# Patient Record
Sex: Female | Born: 1950 | Race: White | Hispanic: No | Marital: Married | Smoking: Never smoker
Health system: Southern US, Community
[De-identification: ages and names within clinical notes are randomized; demographics above are authoritative.]

## PROBLEM LIST (undated history)

## (undated) DIAGNOSIS — F419 Anxiety disorder, unspecified: Secondary | ICD-10-CM

## (undated) DIAGNOSIS — M8580 Other specified disorders of bone density and structure, unspecified site: Secondary | ICD-10-CM

## (undated) DIAGNOSIS — I959 Hypotension, unspecified: Secondary | ICD-10-CM

## (undated) DIAGNOSIS — E785 Hyperlipidemia, unspecified: Secondary | ICD-10-CM

## (undated) DIAGNOSIS — R413 Other amnesia: Secondary | ICD-10-CM

## (undated) DIAGNOSIS — R634 Abnormal weight loss: Secondary | ICD-10-CM

## (undated) DIAGNOSIS — R339 Retention of urine, unspecified: Secondary | ICD-10-CM

## (undated) DIAGNOSIS — H04129 Dry eye syndrome of unspecified lacrimal gland: Secondary | ICD-10-CM

## (undated) DIAGNOSIS — R630 Anorexia: Secondary | ICD-10-CM

## (undated) DIAGNOSIS — R42 Dizziness and giddiness: Secondary | ICD-10-CM

## (undated) HISTORY — DX: Dry eye syndrome of unspecified lacrimal gland: H04.129

## (undated) HISTORY — PX: ABDOMINAL HYSTERECTOMY: SHX81

## (undated) HISTORY — DX: Abnormal weight loss: R63.4

## (undated) HISTORY — PX: APPENDECTOMY: SHX54

## (undated) HISTORY — DX: Other specified disorders of bone density and structure, unspecified site: M85.80

## (undated) HISTORY — DX: Retention of urine, unspecified: R33.9

## (undated) HISTORY — DX: Hyperlipidemia, unspecified: E78.5

## (undated) HISTORY — DX: Other amnesia: R41.3

## (undated) HISTORY — DX: Dizziness and giddiness: R42

## (undated) HISTORY — DX: Hypotension, unspecified: I95.9

## (undated) HISTORY — DX: Anorexia: R63.0

## (undated) HISTORY — PX: CATARACT EXTRACTION: SUR2

## (undated) HISTORY — DX: Anxiety disorder, unspecified: F41.9

---

## 1998-08-06 ENCOUNTER — Other Ambulatory Visit: Admission: RE | Admit: 1998-08-06 | Discharge: 1998-08-06 | Payer: Self-pay | Admitting: Obstetrics and Gynecology

## 1999-08-10 ENCOUNTER — Other Ambulatory Visit: Admission: RE | Admit: 1999-08-10 | Discharge: 1999-08-10 | Payer: Self-pay | Admitting: Obstetrics and Gynecology

## 2000-01-03 ENCOUNTER — Encounter (INDEPENDENT_AMBULATORY_CARE_PROVIDER_SITE_OTHER): Payer: Self-pay

## 2000-01-03 ENCOUNTER — Ambulatory Visit (HOSPITAL_COMMUNITY): Admission: RE | Admit: 2000-01-03 | Discharge: 2000-01-03 | Payer: Self-pay | Admitting: Gastroenterology

## 2000-08-15 ENCOUNTER — Other Ambulatory Visit: Admission: RE | Admit: 2000-08-15 | Discharge: 2000-08-15 | Payer: Self-pay | Admitting: Obstetrics and Gynecology

## 2001-09-06 ENCOUNTER — Other Ambulatory Visit: Admission: RE | Admit: 2001-09-06 | Discharge: 2001-09-06 | Payer: Self-pay | Admitting: Obstetrics and Gynecology

## 2002-09-19 ENCOUNTER — Other Ambulatory Visit: Admission: RE | Admit: 2002-09-19 | Discharge: 2002-09-19 | Payer: Self-pay | Admitting: Obstetrics and Gynecology

## 2003-09-30 ENCOUNTER — Other Ambulatory Visit: Admission: RE | Admit: 2003-09-30 | Discharge: 2003-09-30 | Payer: Self-pay | Admitting: Obstetrics and Gynecology

## 2004-10-08 ENCOUNTER — Other Ambulatory Visit: Admission: RE | Admit: 2004-10-08 | Discharge: 2004-10-08 | Payer: Self-pay | Admitting: Obstetrics and Gynecology

## 2005-10-13 ENCOUNTER — Other Ambulatory Visit: Admission: RE | Admit: 2005-10-13 | Discharge: 2005-10-13 | Payer: Self-pay | Admitting: Obstetrics and Gynecology

## 2006-10-19 ENCOUNTER — Other Ambulatory Visit: Admission: RE | Admit: 2006-10-19 | Discharge: 2006-10-19 | Payer: Self-pay | Admitting: Obstetrics and Gynecology

## 2007-10-25 ENCOUNTER — Other Ambulatory Visit: Admission: RE | Admit: 2007-10-25 | Discharge: 2007-10-25 | Payer: Self-pay | Admitting: Obstetrics and Gynecology

## 2008-10-21 ENCOUNTER — Encounter: Admission: RE | Admit: 2008-10-21 | Discharge: 2008-10-21 | Payer: Self-pay | Admitting: Obstetrics and Gynecology

## 2008-11-06 ENCOUNTER — Encounter: Payer: Self-pay | Admitting: Obstetrics and Gynecology

## 2008-11-06 ENCOUNTER — Other Ambulatory Visit: Admission: RE | Admit: 2008-11-06 | Discharge: 2008-11-06 | Payer: Self-pay | Admitting: Obstetrics and Gynecology

## 2008-11-06 ENCOUNTER — Ambulatory Visit: Payer: Self-pay | Admitting: Obstetrics and Gynecology

## 2009-01-06 ENCOUNTER — Encounter: Admission: RE | Admit: 2009-01-06 | Discharge: 2009-01-06 | Payer: Self-pay | Admitting: Obstetrics and Gynecology

## 2009-12-01 ENCOUNTER — Encounter: Admission: RE | Admit: 2009-12-01 | Discharge: 2009-12-01 | Payer: Self-pay | Admitting: Obstetrics & Gynecology

## 2010-10-01 NOTE — Procedures (Signed)
[facility]  Patient:    Jennifer Mccall, CRUMBLEY                    MRN: [id number] Proc. Date: 01/03/00 Adm. Date:  [id number] Attending:  Rich Brave CC:         Rande Brunt. Eda Paschal, M.D.   Procedure Report  PROCEDURE:  Colonoscopy with biopsies.  INDICATIONS FOR PROCEDURE:  A 60 year old with history of intermittent small volume hematochezia and family history of colon cancer in her grandmother as well as colon polyps in her mother.  FINDINGS:  Unremarkable exam. Tiny possible early polyp in the ascending colon.  DESCRIPTION OF PROCEDURE:  The nature, purpose and risk of the procedure had been discussed with the patient who provided written consent. Sedation was fentanyl 62.5 mcg and Versed 7 mg IV without arrhythmias or desaturation. The Olympus pediatric video colonoscope was advanced through a somewhat looping and spastic sigmoid region and then fairly easily around the colon to the terminal ileum, which had a normal appearance. Pullback was then performed. I did have the patient in the supine position and there was application of external abdominal compression to facilitate advancement of the scope during insertion.  There was what appeared to be a very small sessile raised area on a fold in the proximal ascending colon, possibly representing an early polyp. Several biopsies were obtained of this area for histologic analysis and it appears that these essentially excised the tissue in question.  No definite polyps, no large polyps, no cancer, no colitis, no diverticular disease nor any vascular malformations were observed.  I did not see any source of bleeding in the patients rectum.  The patient tolerated the procedure well and there were no apparent complications.  IMPRESSION:  Questionable early polyp in the ascending colon, otherwise normal exam.  PLAN:  Consider follow-up colonoscopy in 5 years in view of the family  history of colorectal neoplasia, and possibly a flexible sigmoidoscopy prior to that time if the patient continues to have rectal bleeding. DD:  01/03/00 TD:  01/03/00 Job: [id number]

## 2010-12-17 ENCOUNTER — Other Ambulatory Visit: Payer: Self-pay | Admitting: Obstetrics & Gynecology

## 2010-12-17 DIAGNOSIS — Z1231 Encounter for screening mammogram for malignant neoplasm of breast: Secondary | ICD-10-CM

## 2011-01-05 ENCOUNTER — Ambulatory Visit
Admission: RE | Admit: 2011-01-05 | Discharge: 2011-01-05 | Disposition: A | Discharge status: Home / Self Care | Payer: BC Managed Care – PPO | Source: Ambulatory Visit | Attending: Obstetrics & Gynecology | Admitting: Obstetrics & Gynecology

## 2011-01-05 DIAGNOSIS — Z1231 Encounter for screening mammogram for malignant neoplasm of breast: Secondary | ICD-10-CM

## 2012-02-27 ENCOUNTER — Ambulatory Visit: Payer: BC Managed Care – PPO | Admitting: Cardiovascular Disease

## 2012-03-20 ENCOUNTER — Encounter: Payer: Self-pay | Admitting: Cardiovascular Disease

## 2012-03-22 ENCOUNTER — Ambulatory Visit (INDEPENDENT_AMBULATORY_CARE_PROVIDER_SITE_OTHER): Payer: BC Managed Care – PPO | Admitting: Cardiovascular Disease

## 2012-03-22 ENCOUNTER — Encounter: Payer: Self-pay | Admitting: Cardiovascular Disease

## 2012-03-22 VITALS — BP 118/72 | HR 63 | Ht 63.6 in | Wt 139.0 lb

## 2012-03-22 DIAGNOSIS — R002 Palpitations: Secondary | ICD-10-CM

## 2012-03-22 LAB — BASIC METABOLIC PANEL
BUN: 12 mg/dL (ref 6–23)
CO2: 29 mEq/L (ref 19–32)
Calcium: 9.5 mg/dL (ref 8.4–10.5)

## 2012-03-22 NOTE — Patient Instructions (Addendum)
Your physician recommends that you schedule a follow-up appointment in:  4 weeks.   Your physician has recommended that you wear a holter monitor. Holter monitors are medical devices that record the heart's electrical activity. Doctors most often use these monitors to diagnose arrhythmias. Arrhythmias are problems with the speed or rhythm of the heartbeat. The monitor is a small, portable device. You can wear one while you do your normal daily activities. This is usually used to diagnose what is causing palpitations/syncope (passing out).  Your physician has requested that you have an echocardiogram. Echocardiography is a painless test that uses sound waves to create images of your heart. It provides your doctor with information about the size and shape of your heart and how well your heart's chambers and valves are working. This procedure takes approximately one hour. There are no restrictions for this procedure.   

## 2012-03-22 NOTE — Progress Notes (Signed)
   History of Present Illness: 61 yo WF with history of hyperlipidemia here today for evaluation of "racing heart". She has been noticing this for 1.5 years. This lasts for several minutes. This happens most during stress. No chest pain. She does have feeling of dyspnea at times. EKG has been normal in the past. She has been treated with Xanax for anxiety. Going through a divorce and is under much stress. She has been living with her business partner for the last year.   Primary Care Physician: Loree Fee, PA-C.   Total chol 239  HDL 96  LDL 131  Past Medical History  Diagnosis Date  . Hyperlipidemia   . Anxiety     Past Surgical History  Procedure Date  . Abdominal hysterectomy   . Appendectomy     Current Outpatient Prescriptions  Medication Sig Dispense Refill  . acyclovir (ZOVIRAX) 200 MG capsule Take 200 mg by mouth once. Daily      . aspirin 325 MG tablet Take 325 mg by mouth daily.        Allergies  Allergen Reactions  . Codeine   . Demerol (Meperidine)   . Erythorbic Acid     History   Social History  . Marital Status: Unknown    Spouse Name: N/A    Number of Children: 2  . Years of Education: N/A   Occupational History  . Counselor    Social History Main Topics  . Smoking status: Never Smoker   . Smokeless tobacco: Not on file  . Alcohol Use: 3.5 oz/week    7 drink(s) per week  . Drug Use: No  . Sexually Active: Not on file   Other Topics Concern  . Not on file   Social History Narrative  . No narrative on file    Family History  Problem Relation Age of Onset  . Dementia Mother   . Cancer - Lung Father   . Heart attack Paternal Grandfather     Review of Systems:  As stated in the HPI and otherwise negative.   BP 118/72  Pulse 63  Ht 5' 3.6" (1.615 m)  Wt 139 lb (63.05 kg)  BMI 24.16 kg/m2  SpO2 100%  Physical Examination: General: Well developed, well nourished, NAD HEENT: OP clear, mucus membranes moist SKIN: warm, dry. No  rashes. Neuro: No focal deficits Musculoskeletal: Muscle strength 5/5 all ext Psychiatric: Mood and affect normal Neck: No JVD, no carotid bruits, no thyromegaly, no lymphadenopathy. Lungs:Clear bilaterally, no wheezes, rhonci, crackles Cardiovascular: Regular rate and rhythm. No murmurs, gallops or rubs. Abdomen:Soft. Bowel sounds present. Non-tender.  Extremities: No lower extremity edema. Pulses are 2 + in the bilateral DP/PT.  EKG: NSR, rate 60 bpm.   Assessment and Plan:   1. Palpitations: Likely premature beats. Will check echo to exclude structural heart disease. Will have her wear a 48 hour Holter monitor. Will check TSH and BMET today.

## 2012-04-03 ENCOUNTER — Encounter: Payer: Self-pay | Admitting: Cardiovascular Disease

## 2012-04-10 ENCOUNTER — Other Ambulatory Visit: Payer: Self-pay | Admitting: Obstetrics & Gynecology

## 2012-04-10 DIAGNOSIS — Z1231 Encounter for screening mammogram for malignant neoplasm of breast: Secondary | ICD-10-CM

## 2012-04-17 ENCOUNTER — Other Ambulatory Visit (HOSPITAL_COMMUNITY): Payer: BC Managed Care – PPO

## 2012-05-02 ENCOUNTER — Ambulatory Visit: Payer: BC Managed Care – PPO | Admitting: Cardiovascular Disease

## 2012-06-05 ENCOUNTER — Ambulatory Visit
Admission: RE | Admit: 2012-06-05 | Discharge: 2012-06-05 | Disposition: A | Discharge status: Home / Self Care | Payer: BC Managed Care – PPO | Source: Ambulatory Visit | Attending: Obstetrics & Gynecology | Admitting: Obstetrics & Gynecology

## 2012-06-05 DIAGNOSIS — Z1231 Encounter for screening mammogram for malignant neoplasm of breast: Secondary | ICD-10-CM

## 2012-11-20 ENCOUNTER — Telehealth: Payer: Self-pay | Admitting: Cardiovascular Disease

## 2012-11-20 DIAGNOSIS — R0600 Dyspnea, unspecified: Secondary | ICD-10-CM

## 2012-11-20 DIAGNOSIS — R002 Palpitations: Secondary | ICD-10-CM

## 2012-11-20 NOTE — Telephone Encounter (Signed)
Spoke with pt. She reports she canceled previous tests because she got very busy at work. She is continuing to have palpitations and would like to schedule tests at this time.  Symptoms same as when she previously saw Dr. Clifton James.  I told pt I would have schedulers contact her with appointment times. Appt made for pt to see Dr. Clifton James on January 09, 2013 at 9:30 for follow up and to review results.

## 2012-11-20 NOTE — Telephone Encounter (Signed)
Ok. Thanks. chris

## 2012-11-20 NOTE — Telephone Encounter (Signed)
New problem    Patient had cancel her echo & holter appt in Dec 2013. Need an new order put into the system for holter  & echo.

## 2012-11-20 NOTE — Telephone Encounter (Signed)
Pt saw Dr. Clifton James in November 2013 with 48 hr holter and echo planned. Pt cancelled these appointments and also cancelled follow up with Dr. Clifton James. Cancellation notes indicate she cancelled due to insurance and would call back in January to reschedule.  I left message for pt to call back to see how she was feeling.

## 2012-11-22 ENCOUNTER — Other Ambulatory Visit: Payer: Self-pay | Admitting: *Deleted

## 2012-11-22 DIAGNOSIS — R0600 Dyspnea, unspecified: Secondary | ICD-10-CM

## 2012-11-27 ENCOUNTER — Other Ambulatory Visit (HOSPITAL_COMMUNITY): Payer: BC Managed Care – PPO

## 2012-12-10 ENCOUNTER — Encounter (INDEPENDENT_AMBULATORY_CARE_PROVIDER_SITE_OTHER): Payer: BC Managed Care – PPO

## 2012-12-10 ENCOUNTER — Ambulatory Visit (HOSPITAL_COMMUNITY): Payer: BC Managed Care – PPO | Attending: Internal Medicine | Admitting: Radiology

## 2012-12-10 ENCOUNTER — Encounter: Payer: Self-pay | Admitting: *Deleted

## 2012-12-10 DIAGNOSIS — I079 Rheumatic tricuspid valve disease, unspecified: Secondary | ICD-10-CM | POA: Insufficient documentation

## 2012-12-10 DIAGNOSIS — R002 Palpitations: Secondary | ICD-10-CM | POA: Insufficient documentation

## 2012-12-10 DIAGNOSIS — E785 Hyperlipidemia, unspecified: Secondary | ICD-10-CM | POA: Insufficient documentation

## 2012-12-10 DIAGNOSIS — I359 Nonrheumatic aortic valve disorder, unspecified: Secondary | ICD-10-CM | POA: Insufficient documentation

## 2012-12-10 DIAGNOSIS — R0609 Other forms of dyspnea: Secondary | ICD-10-CM | POA: Insufficient documentation

## 2012-12-10 DIAGNOSIS — R0600 Dyspnea, unspecified: Secondary | ICD-10-CM

## 2012-12-10 DIAGNOSIS — R0989 Other specified symptoms and signs involving the circulatory and respiratory systems: Secondary | ICD-10-CM | POA: Insufficient documentation

## 2012-12-10 DIAGNOSIS — R0602 Shortness of breath: Secondary | ICD-10-CM | POA: Insufficient documentation

## 2012-12-10 NOTE — Progress Notes (Signed)
Echocardiogram performed.  

## 2012-12-10 NOTE — Progress Notes (Signed)
Patient ID: Jennifer Mccall, female   DOB: 06/06/1950, 62 y.o.   MRN: [id number] EVO 48 Hour Holter Monitor applied to patient.

## 2012-12-24 ENCOUNTER — Telehealth: Payer: Self-pay | Admitting: *Deleted

## 2012-12-24 NOTE — Telephone Encounter (Signed)
I placed call to pt to review monitor results. Left message to call back 

## 2012-12-25 NOTE — Telephone Encounter (Signed)
F/up  Pt returning a phone call... Pt request that if a message is left please leave a name so that she may know who to call back.

## 2012-12-25 NOTE — Telephone Encounter (Signed)
Returned call to patient monitor results given.Advised to avoid caffeine.Advised to keep appointment with Dr.McAlhany 01/09/13.

## 2013-01-09 ENCOUNTER — Ambulatory Visit (INDEPENDENT_AMBULATORY_CARE_PROVIDER_SITE_OTHER): Payer: BC Managed Care – PPO | Admitting: Cardiovascular Disease

## 2013-01-09 ENCOUNTER — Encounter: Payer: Self-pay | Admitting: Cardiovascular Disease

## 2013-01-09 VITALS — BP 100/60 | HR 62 | Ht 64.0 in | Wt 142.0 lb

## 2013-01-09 DIAGNOSIS — I340 Nonrheumatic mitral (valve) insufficiency: Secondary | ICD-10-CM

## 2013-01-09 DIAGNOSIS — I4949 Other premature depolarization: Secondary | ICD-10-CM

## 2013-01-09 DIAGNOSIS — I493 Ventricular premature depolarization: Secondary | ICD-10-CM | POA: Insufficient documentation

## 2013-01-09 DIAGNOSIS — R002 Palpitations: Secondary | ICD-10-CM

## 2013-01-09 DIAGNOSIS — I059 Rheumatic mitral valve disease, unspecified: Secondary | ICD-10-CM

## 2013-01-09 DIAGNOSIS — I498 Other specified cardiac arrhythmias: Secondary | ICD-10-CM

## 2013-01-09 DIAGNOSIS — I471 Supraventricular tachycardia: Secondary | ICD-10-CM | POA: Insufficient documentation

## 2013-01-09 NOTE — Progress Notes (Signed)
History of Present Illness: 62 yo WF with history of hyperlipidemia here today for cardiac follow up. I saw her 03/22/12 for evaluation of "racing heart". She had been noticing this for 1.5 years. This lasts for several minutes. This happens most during stress. No chest pain. She did have feeling of dyspnea at times. I arranged an echo and a 48 hour Holter monitor but she cancelled and also cancelled f/u with me. She called July 2014 and had c/o continued palpitations. 48 hour holter monitor showed PACs, PVCs, 7 beat run SVT. Echo with normal LV size and function, mild AI, moderate MR.   She is here today for follow up.   Primary Care Physician: Loree Fee, PA-C.   Past Medical History  Diagnosis Date  . Hyperlipidemia   . Anxiety     Past Surgical History  Procedure Laterality Date  . Abdominal hysterectomy    . Appendectomy      Current Outpatient Prescriptions  Medication Sig Dispense Refill  . acyclovir (ZOVIRAX) 200 MG capsule Take 200 mg by mouth once. Daily      . aspirin 325 MG tablet Take 325 mg by mouth daily.       No current facility-administered medications for this visit.    Allergies  Allergen Reactions  . Codeine   . Demerol [Meperidine]   . Erythorbic Acid     History   Social History  . Marital Status: Unknown    Spouse Name: N/A    Number of Children: 2  . Years of Education: N/A   Occupational History  . Counselor    Social History Main Topics  . Smoking status: Never Smoker   . Smokeless tobacco: Not on file  . Alcohol Use: 3.5 oz/week    7 drink(s) per week  . Drug Use: No  . Sexual Activity: Not on file   Other Topics Concern  . Not on file   Social History Narrative  . No narrative on file    Family History  Problem Relation Age of Onset  . Dementia Mother   . Cancer - Lung Father   . Heart attack Paternal Grandfather     Review of Systems:  As stated in the HPI and otherwise negative.   BP 100/60  Pulse 62  Ht 5\' 4"   (1.626 m)  Wt 142 lb (64.411 kg)  BMI 24.36 kg/m2  Physical Examination: General: Well developed, well nourished, NAD HEENT: OP clear, mucus membranes moist SKIN: warm, dry. No rashes. Neuro: No focal deficits Musculoskeletal: Muscle strength 5/5 all ext Psychiatric: Mood and affect normal Neck: No JVD, no carotid bruits, no thyromegaly, no lymphadenopathy. Lungs:Clear bilaterally, no wheezes, rhonci, crackles Cardiovascular: Regular rate and rhythm. No murmurs, gallops or rubs. Abdomen:Soft. Bowel sounds present. Non-tender.  Extremities: No lower extremity edema. Pulses are 2 + in the bilateral DP/PT.  Echo 12/10/12: Left ventricle: The cavity size was normal. Wall thickness was normal. Systolic function was normal. The estimated ejection fraction was in the range of 55% to 60%. - Aortic valve: Mild regurgitation. - Mitral valve: MR is directed more posterior into LA At least moderate in intensity.  Assessment and Plan:   1. Palpitations: Holter monitor with PVCs, PACs, short run of SVT. She thinks these are better over last few weeks. Clear triggers to symptoms. She is asked to avoid these triggers including caffeine and over the counter stimulants. I have discussed starting Toprol XL but she wishes to not start any medications. Will call  with worsening of symptoms.   2. Mitral regurgitation: Moderate by echo. Repeat echo one year.

## 2013-01-09 NOTE — Patient Instructions (Addendum)
Your physician wants you to follow-up in:  12 months. You will receive a reminder letter in the mail two months in advance. If you don't receive a letter, please call our office to schedule the follow-up appointment.  Your physician has requested that you have an echocardiogram. Echocardiography is a painless test that uses sound waves to create images of your heart. It provides your doctor with information about the size and shape of your heart and how well your heart's chambers and valves are working. This procedure takes approximately one hour. There are no restrictions for this procedure. To be done in 1-2 months.     

## 2013-06-15 ENCOUNTER — Other Ambulatory Visit: Payer: Self-pay | Admitting: Gynecology

## 2013-06-17 MED ORDER — ACYCLOVIR 200 MG PO CAPS: ORAL_CAPSULE | ORAL | Status: DC

## 2013-06-17 NOTE — Addendum Note (Signed)
Addended by: Alfonzo Feller on: 06/17/2013 05:55 PM   Modules accepted: Orders

## 2013-06-17 NOTE — Telephone Encounter (Signed)
Last refilled: 03/20/12 No current AEX  S/W patient she has moved to [place] she has not found GYN yet was wondering if Dr. Charlies Constable could refill her Acyclovir for 2 month that way it would give her time to find a GYN I told patient I would check with Dr.Lathrop first.  Please advise.

## 2013-06-17 NOTE — Telephone Encounter (Signed)
Chart In Your Door

## 2014-07-14 ENCOUNTER — Other Ambulatory Visit: Payer: Self-pay | Admitting: *Deleted

## 2014-07-14 NOTE — Telephone Encounter (Signed)
Faxed Medication refill request: Acyclovir 200 mg  Last AEX:  03/20/12 with TL Next AEX: No AEX scheduled  Last MMG (if hormonal medication request): N/A Refill authorized: None   Patient states her physician is in [place] and this fax wasn't supposed to be faxed to [place]. She said she will contact her doctor's office in regards to refill and appreciative of the call.  RX denied through our system.  Routed to provider for review, encounter closed.

## 2016-05-25 DIAGNOSIS — E785 Hyperlipidemia, unspecified: Secondary | ICD-10-CM | POA: Diagnosis not present

## 2016-05-25 DIAGNOSIS — Z0000 Encounter for general adult medical examination without abnormal findings: Secondary | ICD-10-CM | POA: Diagnosis not present

## 2016-05-25 DIAGNOSIS — Z1211 Encounter for screening for malignant neoplasm of colon: Secondary | ICD-10-CM | POA: Diagnosis not present

## 2016-05-25 DIAGNOSIS — Z8639 Personal history of other endocrine, nutritional and metabolic disease: Secondary | ICD-10-CM | POA: Diagnosis not present

## 2016-10-10 ENCOUNTER — Encounter (HOSPITAL_COMMUNITY): Payer: Self-pay | Admitting: Emergency Medicine

## 2016-10-10 ENCOUNTER — Emergency Department (HOSPITAL_COMMUNITY)
Admission: EM | Admit: 2016-10-10 | Discharge: 2016-10-10 | Disposition: A | Discharge status: Home / Self Care | Payer: Medicare Other | Attending: Physician Assistant | Admitting: Physician Assistant

## 2016-10-10 DIAGNOSIS — R55 Syncope and collapse: Secondary | ICD-10-CM | POA: Diagnosis not present

## 2016-10-10 DIAGNOSIS — Z79899 Other long term (current) drug therapy: Secondary | ICD-10-CM | POA: Diagnosis not present

## 2016-10-10 DIAGNOSIS — R110 Nausea: Secondary | ICD-10-CM | POA: Diagnosis not present

## 2016-10-10 DIAGNOSIS — R404 Transient alteration of awareness: Secondary | ICD-10-CM | POA: Diagnosis not present

## 2016-10-10 DIAGNOSIS — Z7982 Long term (current) use of aspirin: Secondary | ICD-10-CM | POA: Insufficient documentation

## 2016-10-10 LAB — BASIC METABOLIC PANEL
ANION GAP: 10 (ref 5–15)
BUN: 15 mg/dL (ref 6–20)
CALCIUM: 8.7 mg/dL — AB (ref 8.9–10.3)
CHLORIDE: 100 mmol/L — AB (ref 101–111)
CO2: 23 mmol/L (ref 22–32)
Creatinine, Ser: 0.87 mg/dL (ref 0.44–1.00)
GFR calc non Af Amer: >60 mL/min (ref >60)
Glucose, Bld: 125 mg/dL — ABNORMAL HIGH (ref 65–99)
Potassium: 3.8 mmol/L (ref 3.5–5.1)
Sodium: 133 mmol/L — ABNORMAL LOW (ref 135–145)

## 2016-10-10 LAB — CBC
HCT: 39.6 % (ref 36.0–46.0)
HEMOGLOBIN: 13.3 g/dL (ref 12.0–15.0)
MCH: 31.6 pg (ref 26.0–34.0)
MCHC: 33.6 g/dL (ref 30.0–36.0)
MCV: 94.1 fL (ref 78.0–100.0)
Platelets: 109 10*3/uL — ABNORMAL LOW (ref 150–400)
RBC: 4.21 MIL/uL (ref 3.87–5.11)
RDW: 13.1 % (ref 11.5–15.5)
WBC: 3.3 10*3/uL — ABNORMAL LOW (ref 4.0–10.5)

## 2016-10-10 MED ORDER — SODIUM CHLORIDE 0.9 % IV BOLUS (SEPSIS)
1000.0000 mL | Freq: Once | INTRAVENOUS | Status: AC
  Administered 2016-10-10: 1000 mL via INTRAVENOUS

## 2016-10-10 NOTE — ED Notes (Signed)
Pt ambulated well to the RR 

## 2016-10-10 NOTE — ED Triage Notes (Signed)
Pt arrives via [facility] reporting near syncope while walking this am, reports "seeing spots", dry heaves with mild HA.  Pt denies LOC, recent n/v/d. EMS reports pt had no palpable BP on their arrival, reports giving 44mL NS, BP on arrival to ED 112/57. PT AOx4.

## 2016-10-10 NOTE — ED Provider Notes (Signed)
[facility] Provider Note   CSN: [id number] Arrival date & time: 10/10/16  0945     History   Chief Complaint Chief Complaint  Patient presents with  . Near Syncope    HPI CORINDA AMMON is a 66 y.o. female.  HPI   Patient is a 65 year old female presenting with syncope. Patient went to [place] toxicity morning It  is very humid outside should not eat or drink beforehand. She went on hour-long walk out side and at the end.she began to develop like she is going to pass out. She felt lightheaded and nauseated  she called EMS was by here to the emergency department. She feels much improved since arrival.  Past Medical History:  Diagnosis Date  . Anxiety   . Hyperlipidemia     Patient Active Problem List   Diagnosis Date Noted  . PVC (premature ventricular contraction) 01/09/2013  . SVT (supraventricular tachycardia) ([place]) 01/09/2013  . Palpitations 03/22/2012    Past Surgical History:  Procedure Laterality Date  . ABDOMINAL HYSTERECTOMY    . APPENDECTOMY      OB History    No data available       Home Medications    Prior to Admission medications   Medication Sig Start Date End Date Taking? Authorizing Provider  acyclovir (ZOVIRAX) 200 MG capsule TAKE ONE CAPSULE BY MOUTH TWICE A DAY 06/17/13   Elveria Rising, MD  aspirin 325 MG tablet Take 325 mg by mouth daily.    [provider]    Family History Family History  Problem Relation Age of Onset  . Dementia Mother   . Cancer - Lung Father   . Heart attack Paternal Grandfather     Social History Social History  Substance Use Topics  . Smoking status: Never Smoker  . Smokeless tobacco: Never Used  . Alcohol use 6.0 oz/week    10 Standard drinks or equivalent per week     Comment: "one to two" a night     Allergies   Erythromycin; Codeine; Demerol [meperidine]; and Erythorbic acid   Review of Systems Review of Systems  Constitutional: Negative for activity change.    Respiratory: Negative for shortness of breath.   Cardiovascular: Negative for chest pain.  Gastrointestinal: Negative for abdominal pain.  Neurological: Positive for light-headedness. Negative for tremors, syncope, speech difficulty, weakness and headaches.  All other systems reviewed and are negative.    Physical Exam Updated Vital Signs BP (!) 104/53   Pulse 60   Temp 97.8 F (36.6 C) (Oral)   Resp 14   Ht 5\' 3"  (1.6 m)   Wt 59 kg (130 lb)   SpO2 100%   BMI 23.03 kg/m   Physical Exam  Constitutional: She is oriented to person, place, and time. She appears well-developed and well-nourished.  HENT:  Head: Normocephalic and atraumatic.  Eyes: Right eye exhibits no discharge. Left eye exhibits no discharge.  Cardiovascular: Normal rate, regular rhythm and normal heart sounds.   No murmur heard. Pulmonary/Chest: Effort normal and breath sounds normal. She has no wheezes. She has no rales.  Abdominal: Soft. She exhibits no distension. There is no tenderness.  Musculoskeletal: She exhibits no edema.  Neurological: She is oriented to person, place, and time.  Skin: Skin is warm and dry. She is not diaphoretic.  Psychiatric: She has a normal mood and affect.  Nursing note and vitals reviewed.    ED Treatments / Results  Labs (all labs ordered are listed, but only abnormal  results are displayed) Labs Reviewed  BASIC METABOLIC PANEL - Abnormal; Notable for the following:       Result Value   Sodium 133 (*)    Chloride 100 (*)    Glucose, Bld 125 (*)    Calcium 8.7 (*)    All other components within normal limits  CBC - Abnormal; Notable for the following:    WBC 3.3 (*)    Platelets 109 (*)    All other components within normal limits  CBC WITH DIFFERENTIAL/PLATELET  COMPREHENSIVE METABOLIC PANEL  CBG MONITORING, ED    EKG  EKG Interpretation  Date/Time:  Monday Oct 10 2016 09:49:52 EDT Ventricular Rate:  62 PR Interval:    QRS Duration: 106 QT  Interval:  480 QTC Calculation: 488 R Axis:   28 Text Interpretation:  Sinus rhythm Left atrial enlargement RSR' in V1 or V2, probably normal variant Borderline prolonged QT interval Normal sinus rhythm Confirmed by Thomasene Lot, [place] [phone]) on 10/10/2016 9:58:11 AM Also confirmed by Thomasene Lot, Brantlee Hinde [phone]), editor Verna Czech [phone])  on 10/10/2016 10:25:25 AM       Radiology No results found.  Procedures Procedures (including critical care time)  Medications Ordered in ED Medications  sodium chloride 0.9 % bolus 1,000 mL (1,000 mLs Intravenous New Bag/Given 10/10/16 1032)     Initial Impression / Assessment and Plan / ED Course  I have reviewed the triage vital signs and the nursing notes.  Pertinent labs & imaging results that were available during my care of the patient were reviewed by me and considered in my medical decision making (see chart for details).     Patient is a 66 year old female presenting with syncope. This is likely consistent with dehydration. Patient did not eat or drink anything before exerting herself in a  hot environment. We'll give her fluids, baseline labs, and ambulate patient and likely will discharge home.  11:53 AM Ambulated wihtout issue.  Patient is comfortable, ambulatory, and taking PO at time of discharge.  Patient expressed understanding about return precautions.    Final Clinical Impressions(s) / ED Diagnoses   Final diagnoses:  None    New Prescriptions New Prescriptions   No medications on file     Macarthur Critchley, MD 10/10/16 1153

## 2016-11-10 DIAGNOSIS — L309 Dermatitis, unspecified: Secondary | ICD-10-CM | POA: Diagnosis not present

## 2016-11-10 DIAGNOSIS — D485 Neoplasm of uncertain behavior of skin: Secondary | ICD-10-CM | POA: Diagnosis not present

## 2016-12-02 DIAGNOSIS — N6489 Other specified disorders of breast: Secondary | ICD-10-CM | POA: Diagnosis not present

## 2016-12-02 DIAGNOSIS — C44501 Unspecified malignant neoplasm of skin of breast: Secondary | ICD-10-CM | POA: Diagnosis not present

## 2016-12-05 DIAGNOSIS — L905 Scar conditions and fibrosis of skin: Secondary | ICD-10-CM | POA: Diagnosis not present

## 2016-12-05 DIAGNOSIS — D492 Neoplasm of unspecified behavior of bone, soft tissue, and skin: Secondary | ICD-10-CM | POA: Diagnosis not present

## 2016-12-11 DIAGNOSIS — H2513 Age-related nuclear cataract, bilateral: Secondary | ICD-10-CM | POA: Diagnosis not present

## 2017-01-02 DIAGNOSIS — T148XXA Other injury of unspecified body region, initial encounter: Secondary | ICD-10-CM | POA: Diagnosis not present

## 2017-02-02 DIAGNOSIS — I8391 Asymptomatic varicose veins of right lower extremity: Secondary | ICD-10-CM | POA: Diagnosis not present

## 2017-02-02 DIAGNOSIS — L720 Epidermal cyst: Secondary | ICD-10-CM | POA: Diagnosis not present

## 2017-02-02 DIAGNOSIS — D485 Neoplasm of uncertain behavior of skin: Secondary | ICD-10-CM | POA: Diagnosis not present

## 2017-02-02 DIAGNOSIS — L814 Other melanin hyperpigmentation: Secondary | ICD-10-CM | POA: Diagnosis not present

## 2017-02-02 DIAGNOSIS — D2361 Other benign neoplasm of skin of right upper limb, including shoulder: Secondary | ICD-10-CM | POA: Diagnosis not present

## 2017-02-02 DIAGNOSIS — L821 Other seborrheic keratosis: Secondary | ICD-10-CM | POA: Diagnosis not present

## 2017-06-28 DIAGNOSIS — Z8639 Personal history of other endocrine, nutritional and metabolic disease: Secondary | ICD-10-CM | POA: Diagnosis not present

## 2017-06-28 DIAGNOSIS — E785 Hyperlipidemia, unspecified: Secondary | ICD-10-CM | POA: Diagnosis not present

## 2017-06-28 DIAGNOSIS — Z0000 Encounter for general adult medical examination without abnormal findings: Secondary | ICD-10-CM | POA: Diagnosis not present

## 2018-02-07 DIAGNOSIS — Z1231 Encounter for screening mammogram for malignant neoplasm of breast: Secondary | ICD-10-CM | POA: Diagnosis not present

## 2018-02-07 DIAGNOSIS — C44519 Basal cell carcinoma of skin of other part of trunk: Secondary | ICD-10-CM | POA: Diagnosis not present

## 2018-02-07 DIAGNOSIS — D485 Neoplasm of uncertain behavior of skin: Secondary | ICD-10-CM | POA: Diagnosis not present

## 2018-02-07 DIAGNOSIS — L905 Scar conditions and fibrosis of skin: Secondary | ICD-10-CM | POA: Diagnosis not present

## 2018-02-07 DIAGNOSIS — D2361 Other benign neoplasm of skin of right upper limb, including shoulder: Secondary | ICD-10-CM | POA: Diagnosis not present

## 2018-02-07 DIAGNOSIS — L821 Other seborrheic keratosis: Secondary | ICD-10-CM | POA: Diagnosis not present

## 2018-03-01 DIAGNOSIS — H5203 Hypermetropia, bilateral: Secondary | ICD-10-CM | POA: Diagnosis not present

## 2018-03-01 DIAGNOSIS — H04123 Dry eye syndrome of bilateral lacrimal glands: Secondary | ICD-10-CM | POA: Diagnosis not present

## 2018-03-01 DIAGNOSIS — H524 Presbyopia: Secondary | ICD-10-CM | POA: Diagnosis not present

## 2018-03-01 DIAGNOSIS — H2513 Age-related nuclear cataract, bilateral: Secondary | ICD-10-CM | POA: Diagnosis not present

## 2018-03-07 DIAGNOSIS — E7849 Other hyperlipidemia: Secondary | ICD-10-CM | POA: Diagnosis not present

## 2018-03-07 DIAGNOSIS — R82998 Other abnormal findings in urine: Secondary | ICD-10-CM | POA: Diagnosis not present

## 2018-03-07 DIAGNOSIS — A6000 Herpesviral infection of urogenital system, unspecified: Secondary | ICD-10-CM | POA: Diagnosis not present

## 2018-03-07 DIAGNOSIS — Z23 Encounter for immunization: Secondary | ICD-10-CM | POA: Diagnosis not present

## 2018-03-07 DIAGNOSIS — Z0000 Encounter for general adult medical examination without abnormal findings: Secondary | ICD-10-CM | POA: Diagnosis not present

## 2018-03-07 DIAGNOSIS — M859 Disorder of bone density and structure, unspecified: Secondary | ICD-10-CM | POA: Diagnosis not present

## 2018-03-07 DIAGNOSIS — Z1389 Encounter for screening for other disorder: Secondary | ICD-10-CM | POA: Diagnosis not present

## 2018-03-07 DIAGNOSIS — Z6822 Body mass index (BMI) 22.0-22.9, adult: Secondary | ICD-10-CM | POA: Diagnosis not present

## 2018-04-16 DIAGNOSIS — R159 Full incontinence of feces: Secondary | ICD-10-CM | POA: Diagnosis not present

## 2018-04-16 DIAGNOSIS — R143 Flatulence: Secondary | ICD-10-CM | POA: Diagnosis not present

## 2018-04-16 DIAGNOSIS — R198 Other specified symptoms and signs involving the digestive system and abdomen: Secondary | ICD-10-CM | POA: Diagnosis not present

## 2018-04-16 DIAGNOSIS — K5901 Slow transit constipation: Secondary | ICD-10-CM | POA: Diagnosis not present

## 2018-05-29 DIAGNOSIS — Z1212 Encounter for screening for malignant neoplasm of rectum: Secondary | ICD-10-CM | POA: Diagnosis not present

## 2018-07-04 DIAGNOSIS — R143 Flatulence: Secondary | ICD-10-CM | POA: Diagnosis not present

## 2018-07-04 DIAGNOSIS — R198 Other specified symptoms and signs involving the digestive system and abdomen: Secondary | ICD-10-CM | POA: Diagnosis not present

## 2018-07-04 DIAGNOSIS — K5901 Slow transit constipation: Secondary | ICD-10-CM | POA: Diagnosis not present

## 2018-07-04 DIAGNOSIS — Z1211 Encounter for screening for malignant neoplasm of colon: Secondary | ICD-10-CM | POA: Diagnosis not present

## 2018-11-07 DIAGNOSIS — H01004 Unspecified blepharitis left upper eyelid: Secondary | ICD-10-CM | POA: Diagnosis not present

## 2018-11-07 DIAGNOSIS — H01001 Unspecified blepharitis right upper eyelid: Secondary | ICD-10-CM | POA: Diagnosis not present

## 2018-11-07 DIAGNOSIS — H04123 Dry eye syndrome of bilateral lacrimal glands: Secondary | ICD-10-CM | POA: Diagnosis not present

## 2018-11-29 DIAGNOSIS — H01001 Unspecified blepharitis right upper eyelid: Secondary | ICD-10-CM | POA: Diagnosis not present

## 2018-11-29 DIAGNOSIS — H01004 Unspecified blepharitis left upper eyelid: Secondary | ICD-10-CM | POA: Diagnosis not present

## 2018-11-29 DIAGNOSIS — H2513 Age-related nuclear cataract, bilateral: Secondary | ICD-10-CM | POA: Diagnosis not present

## 2018-11-29 DIAGNOSIS — H04123 Dry eye syndrome of bilateral lacrimal glands: Secondary | ICD-10-CM | POA: Diagnosis not present

## 2019-02-12 DIAGNOSIS — Z85828 Personal history of other malignant neoplasm of skin: Secondary | ICD-10-CM | POA: Diagnosis not present

## 2019-02-12 DIAGNOSIS — C44519 Basal cell carcinoma of skin of other part of trunk: Secondary | ICD-10-CM | POA: Diagnosis not present

## 2019-02-12 DIAGNOSIS — D2361 Other benign neoplasm of skin of right upper limb, including shoulder: Secondary | ICD-10-CM | POA: Diagnosis not present

## 2019-02-12 DIAGNOSIS — D0471 Carcinoma in situ of skin of right lower limb, including hip: Secondary | ICD-10-CM | POA: Diagnosis not present

## 2019-02-13 DIAGNOSIS — Z1231 Encounter for screening mammogram for malignant neoplasm of breast: Secondary | ICD-10-CM | POA: Diagnosis not present

## 2019-03-06 DIAGNOSIS — D0471 Carcinoma in situ of skin of right lower limb, including hip: Secondary | ICD-10-CM | POA: Diagnosis not present

## 2019-03-06 DIAGNOSIS — Z85828 Personal history of other malignant neoplasm of skin: Secondary | ICD-10-CM | POA: Diagnosis not present

## 2019-03-07 DIAGNOSIS — Z23 Encounter for immunization: Secondary | ICD-10-CM | POA: Diagnosis not present

## 2019-03-13 DIAGNOSIS — M859 Disorder of bone density and structure, unspecified: Secondary | ICD-10-CM | POA: Diagnosis not present

## 2019-03-13 DIAGNOSIS — E7849 Other hyperlipidemia: Secondary | ICD-10-CM | POA: Diagnosis not present

## 2019-03-22 DIAGNOSIS — Z1339 Encounter for screening examination for other mental health and behavioral disorders: Secondary | ICD-10-CM | POA: Diagnosis not present

## 2019-03-22 DIAGNOSIS — D696 Thrombocytopenia, unspecified: Secondary | ICD-10-CM | POA: Diagnosis not present

## 2019-03-22 DIAGNOSIS — Z0000 Encounter for general adult medical examination without abnormal findings: Secondary | ICD-10-CM | POA: Diagnosis not present

## 2019-03-22 DIAGNOSIS — E785 Hyperlipidemia, unspecified: Secondary | ICD-10-CM | POA: Diagnosis not present

## 2019-03-22 DIAGNOSIS — Z1331 Encounter for screening for depression: Secondary | ICD-10-CM | POA: Diagnosis not present

## 2019-03-22 DIAGNOSIS — A6000 Herpesviral infection of urogenital system, unspecified: Secondary | ICD-10-CM | POA: Diagnosis not present

## 2019-03-22 DIAGNOSIS — M8580 Other specified disorders of bone density and structure, unspecified site: Secondary | ICD-10-CM | POA: Diagnosis not present

## 2019-03-26 DIAGNOSIS — Z23 Encounter for immunization: Secondary | ICD-10-CM | POA: Diagnosis not present

## 2019-04-01 DIAGNOSIS — Z85828 Personal history of other malignant neoplasm of skin: Secondary | ICD-10-CM | POA: Diagnosis not present

## 2019-04-01 DIAGNOSIS — C44519 Basal cell carcinoma of skin of other part of trunk: Secondary | ICD-10-CM | POA: Diagnosis not present

## 2019-04-15 DIAGNOSIS — Z1159 Encounter for screening for other viral diseases: Secondary | ICD-10-CM | POA: Diagnosis not present

## 2019-04-17 DIAGNOSIS — R194 Change in bowel habit: Secondary | ICD-10-CM | POA: Diagnosis not present

## 2019-04-19 DIAGNOSIS — R194 Change in bowel habit: Secondary | ICD-10-CM | POA: Diagnosis not present

## 2019-06-23 ENCOUNTER — Ambulatory Visit: Payer: Self-pay | Attending: Internal Medicine

## 2019-06-23 DIAGNOSIS — Z23 Encounter for immunization: Secondary | ICD-10-CM | POA: Insufficient documentation

## 2019-06-23 NOTE — Progress Notes (Signed)
   Covid-19 Vaccination Clinic  Name:  Rhiley Gaylor    MRN: [id number] DOB: Nov 15, 1950  06/23/2019  Ms. Beach was observed post Covid-19 immunization for 15 minutes without incidence. She was provided with Vaccine Information Sheet and instruction to access the V-Safe system.   Ms. Prusha was instructed to call 911 with any severe reactions post vaccine: Marland Kitchen Difficulty breathing  . Swelling of your face and throat  . A fast heartbeat  . A bad rash all over your body  . Dizziness and weakness    Immunizations Administered    Name Date Dose VIS Date Route   Pfizer COVID-19 Vaccine 06/23/2019  5:48 PM 0.3 mL 04/26/2019 Intramuscular   Manufacturer: [place]   Lot: [id number]   [place]: [id number]

## 2019-07-11 ENCOUNTER — Ambulatory Visit: Payer: Self-pay

## 2019-07-18 ENCOUNTER — Ambulatory Visit: Payer: Self-pay | Attending: Internal Medicine

## 2019-07-18 DIAGNOSIS — Z23 Encounter for immunization: Secondary | ICD-10-CM | POA: Insufficient documentation

## 2019-07-18 NOTE — Progress Notes (Signed)
° °  Covid-19 Vaccination Clinic  Name:  Jennifer Mccall    MRN: [id number] DOB: 12-05-1950  07/18/2019  [name] was observed post Covid-19 immunization for 15 minutes without incident. She was provided with Vaccine Information Sheet and instruction to access the V-Safe system.   [name] was instructed to call 911 with any severe reactions post vaccine:  Difficulty breathing   Swelling of face and throat   A fast heartbeat   A bad rash all over body   Dizziness and weakness   Immunizations Administered    Name Date Dose VIS Date Route   Pfizer COVID-19 Vaccine 07/18/2019  3:08 PM 0.3 mL 04/26/2019 Intramuscular   Manufacturer: [place]   Lot: [id number]   [place]: [id number]

## 2019-07-22 DIAGNOSIS — H0100B Unspecified blepharitis left eye, upper and lower eyelids: Secondary | ICD-10-CM | POA: Diagnosis not present

## 2019-07-22 DIAGNOSIS — H00015 Hordeolum externum left lower eyelid: Secondary | ICD-10-CM | POA: Diagnosis not present

## 2019-08-09 ENCOUNTER — Other Ambulatory Visit: Payer: Self-pay | Admitting: Internal Medicine

## 2019-08-09 ENCOUNTER — Other Ambulatory Visit (HOSPITAL_COMMUNITY): Payer: Self-pay | Admitting: Internal Medicine

## 2019-08-09 DIAGNOSIS — R4701 Aphasia: Secondary | ICD-10-CM

## 2019-08-09 DIAGNOSIS — R410 Disorientation, unspecified: Secondary | ICD-10-CM

## 2019-08-09 DIAGNOSIS — E785 Hyperlipidemia, unspecified: Secondary | ICD-10-CM | POA: Diagnosis not present

## 2019-08-12 DIAGNOSIS — H25813 Combined forms of age-related cataract, bilateral: Secondary | ICD-10-CM | POA: Diagnosis not present

## 2019-08-12 DIAGNOSIS — H0015 Chalazion left lower eyelid: Secondary | ICD-10-CM | POA: Diagnosis not present

## 2019-08-12 DIAGNOSIS — H16223 Keratoconjunctivitis sicca, not specified as Sjogren's, bilateral: Secondary | ICD-10-CM | POA: Diagnosis not present

## 2019-08-17 ENCOUNTER — Ambulatory Visit (HOSPITAL_COMMUNITY)
Admission: RE | Admit: 2019-08-17 | Discharge: 2019-08-17 | Disposition: A | Discharge status: Home / Self Care | Payer: Medicare Other | Source: Ambulatory Visit | Attending: Internal Medicine | Admitting: Internal Medicine

## 2019-08-17 ENCOUNTER — Other Ambulatory Visit: Payer: Self-pay

## 2019-08-17 DIAGNOSIS — R410 Disorientation, unspecified: Secondary | ICD-10-CM | POA: Insufficient documentation

## 2019-08-17 DIAGNOSIS — R4701 Aphasia: Secondary | ICD-10-CM

## 2019-08-17 DIAGNOSIS — R4182 Altered mental status, unspecified: Secondary | ICD-10-CM | POA: Diagnosis not present

## 2019-08-17 MED ORDER — GADOBUTROL 1 MMOL/ML IV SOLN
5.0000 mL | Freq: Once | INTRAVENOUS | Status: AC | PRN
  Administered 2019-08-17: 5 mL via INTRAVENOUS

## 2019-09-05 DIAGNOSIS — L661 Lichen planopilaris: Secondary | ICD-10-CM | POA: Diagnosis not present

## 2019-09-05 DIAGNOSIS — L820 Inflamed seborrheic keratosis: Secondary | ICD-10-CM | POA: Diagnosis not present

## 2019-09-05 DIAGNOSIS — D2361 Other benign neoplasm of skin of right upper limb, including shoulder: Secondary | ICD-10-CM | POA: Diagnosis not present

## 2019-09-05 DIAGNOSIS — Z85828 Personal history of other malignant neoplasm of skin: Secondary | ICD-10-CM | POA: Diagnosis not present

## 2019-09-05 DIAGNOSIS — L821 Other seborrheic keratosis: Secondary | ICD-10-CM | POA: Diagnosis not present

## 2019-11-26 DIAGNOSIS — L245 Irritant contact dermatitis due to other chemical products: Secondary | ICD-10-CM | POA: Diagnosis not present

## 2019-11-26 DIAGNOSIS — H04129 Dry eye syndrome of unspecified lacrimal gland: Secondary | ICD-10-CM | POA: Diagnosis not present

## 2020-01-09 DIAGNOSIS — L718 Other rosacea: Secondary | ICD-10-CM | POA: Diagnosis not present

## 2020-01-09 DIAGNOSIS — Z85828 Personal history of other malignant neoplasm of skin: Secondary | ICD-10-CM | POA: Diagnosis not present

## 2020-01-09 DIAGNOSIS — L438 Other lichen planus: Secondary | ICD-10-CM | POA: Diagnosis not present

## 2020-01-09 DIAGNOSIS — L821 Other seborrheic keratosis: Secondary | ICD-10-CM | POA: Diagnosis not present

## 2020-01-16 DIAGNOSIS — R634 Abnormal weight loss: Secondary | ICD-10-CM | POA: Diagnosis not present

## 2020-01-16 DIAGNOSIS — R339 Retention of urine, unspecified: Secondary | ICD-10-CM | POA: Diagnosis not present

## 2020-01-16 DIAGNOSIS — I959 Hypotension, unspecified: Secondary | ICD-10-CM | POA: Diagnosis not present

## 2020-01-16 DIAGNOSIS — R42 Dizziness and giddiness: Secondary | ICD-10-CM | POA: Diagnosis not present

## 2020-01-16 DIAGNOSIS — R7301 Impaired fasting glucose: Secondary | ICD-10-CM | POA: Diagnosis not present

## 2020-01-16 DIAGNOSIS — Z8659 Personal history of other mental and behavioral disorders: Secondary | ICD-10-CM | POA: Diagnosis not present

## 2020-01-16 DIAGNOSIS — R82998 Other abnormal findings in urine: Secondary | ICD-10-CM | POA: Diagnosis not present

## 2020-01-16 DIAGNOSIS — E785 Hyperlipidemia, unspecified: Secondary | ICD-10-CM | POA: Diagnosis not present

## 2020-01-22 DIAGNOSIS — H16223 Keratoconjunctivitis sicca, not specified as Sjogren's, bilateral: Secondary | ICD-10-CM | POA: Diagnosis not present

## 2020-01-22 DIAGNOSIS — H02886 Meibomian gland dysfunction of left eye, unspecified eyelid: Secondary | ICD-10-CM | POA: Diagnosis not present

## 2020-01-22 DIAGNOSIS — H2513 Age-related nuclear cataract, bilateral: Secondary | ICD-10-CM | POA: Diagnosis not present

## 2020-01-22 DIAGNOSIS — L718 Other rosacea: Secondary | ICD-10-CM | POA: Diagnosis not present

## 2020-01-22 DIAGNOSIS — H02883 Meibomian gland dysfunction of right eye, unspecified eyelid: Secondary | ICD-10-CM | POA: Diagnosis not present

## 2020-03-09 DIAGNOSIS — H2513 Age-related nuclear cataract, bilateral: Secondary | ICD-10-CM | POA: Diagnosis not present

## 2020-03-09 DIAGNOSIS — H02886 Meibomian gland dysfunction of left eye, unspecified eyelid: Secondary | ICD-10-CM | POA: Diagnosis not present

## 2020-03-09 DIAGNOSIS — H16223 Keratoconjunctivitis sicca, not specified as Sjogren's, bilateral: Secondary | ICD-10-CM | POA: Diagnosis not present

## 2020-03-09 DIAGNOSIS — L718 Other rosacea: Secondary | ICD-10-CM | POA: Diagnosis not present

## 2020-03-09 DIAGNOSIS — H02883 Meibomian gland dysfunction of right eye, unspecified eyelid: Secondary | ICD-10-CM | POA: Diagnosis not present

## 2020-03-16 ENCOUNTER — Other Ambulatory Visit: Payer: Self-pay | Admitting: *Deleted

## 2020-03-16 ENCOUNTER — Encounter: Payer: Self-pay | Admitting: *Deleted

## 2020-03-17 ENCOUNTER — Ambulatory Visit (INDEPENDENT_AMBULATORY_CARE_PROVIDER_SITE_OTHER): Payer: Medicare Other | Admitting: Diagnostic Neuroimaging

## 2020-03-17 ENCOUNTER — Encounter: Payer: Self-pay | Admitting: Diagnostic Neuroimaging

## 2020-03-17 VITALS — BP 122/73 | HR 68 | Ht 63.0 in | Wt <= 1120 oz

## 2020-03-17 DIAGNOSIS — R413 Other amnesia: Secondary | ICD-10-CM

## 2020-03-17 DIAGNOSIS — G3184 Mild cognitive impairment, so stated: Secondary | ICD-10-CM

## 2020-03-17 NOTE — Patient Instructions (Addendum)
MEMORY LOSS / MILD COGNITIVE IMPAIRMENT - safety / supervision issues reviewed - daily physical activity / exercise (at least 15-30 minutes) - eat more plants / vegetables - increase social activities, brain stimulation, games, puzzles, hobbies, crafts, arts, music - aim for at least 7-8 hours sleep per night (or more) - avoid smoking and alcohol - caution with driving and finances

## 2020-03-17 NOTE — Progress Notes (Signed)
[facility]  PATIENT: Jennifer Mccall [facility] DOB: 1951-05-08  REFERRING CLINICIAN: Tisovec, Fransico Him, MD HISTORY FROM: patient  REASON FOR VISIT: new consult    HISTORICAL  CHIEF COMPLAINT:  Chief Complaint  Patient presents with  . Memory Loss    rm 7 New Pt, husband - Bill MMSE 20    HISTORY OF PRESENT ILLNESS:   69 year old female here for evaluation memory loss.  March 2021 patient was visiting the beach on vacation when she woke up in the middle night with some confusion lasting 1 to 2 minutes.  She was trying to use her phone as a television remote.  She was having trouble getting her words out.  She called to her husband who is in the next room.  Symptoms rapidly improved.  Symptoms have continued since that time and are worse with stress.  She is having difficulty with numbers, spelling, words, technology, driving directions.  Patient able to maintain most of her ADLs.  Patient is concerned about dementia due to family history.  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   REVIEW OF SYSTEMS: Full 14 system review of systems performed and negative with exception of: As per HPI.  ALLERGIES: Allergies  Allergen Reactions  . Codeine Anaphylaxis  . Demerol [Meperidine Hcl] Nausea And Vomiting  . Erythromycin Base Hives  . Ether Nausea And Vomiting    HOME MEDICATIONS: Outpatient Medications Prior to Visit  Medication Sig Dispense Refill  . acyclovir (ZOVIRAX) 200 MG capsule     . doxycycline (MONODOX) 50 MG capsule Take 50 mg by mouth daily.    . Multiple Vitamin (QUINTABS) TABS Take 1 tablet by mouth daily.    . cycloSPORINE (RESTASIS) 0.05 % ophthalmic emulsion Place 1 drop into both eyes 2 times daily. (Patient not taking: Reported on 03/17/2020)     No facility-administered medications prior to visit.    PAST MEDICAL HISTORY: Past Medical History:  Diagnosis Date  . Anorexia    H/O  .  Dry eye   . Dyslipidemia   . Hypotension, unspecified   . Lightheadedness   . Memory impairment   . Osteopenia   . Urinary retention with incomplete bladder emptying   . Weight loss, unintentional     PAST SURGICAL HISTORY: Past Surgical History:  Procedure Laterality Date  . ABDOMINAL HYSTERECTOMY    . APPENDECTOMY      FAMILY HISTORY: Family History  Problem Relation Age of Onset  . Glaucoma Mother   . Dementia Mother   . Alcoholism Father   . Lung cancer Father   . Colon cancer Maternal Grandmother   . Heart attack Paternal Grandfather   . Prostate cancer Brother     SOCIAL HISTORY: Social History   Socioeconomic History  . Marital status: Married    Spouse name: Rush Landmark  . Number of children: 2  . Years of education: Not on file  . Highest education level: Master's degree (e.g., MA, MS, MEng, MEd, MSW, MBA)  Occupational History    Comment: retired Social worker  Tobacco Use  . Smoking status: Never Smoker  . Smokeless tobacco: Never Used  Substance and Sexual Activity  . Alcohol use: Yes    Comment: 1 daily  . Drug use: Never  . Sexual activity: Not on file  Other Topics Concern  . Not on file  Social History Narrative   Lives with husband   caffeine 1 a day  Social Determinants of Health   Financial Resource Strain:   . Difficulty of Paying Living Expenses: Not on file  Food Insecurity:   . Worried About Charity fundraiser in the Last Year: Not on file  . Ran Out of Food in the Last Year: Not on file  Transportation Needs:   . Lack of Transportation (Medical): Not on file  . Lack of Transportation (Non-Medical): Not on file  Physical Activity:   . Days of Exercise per Week: Not on file  . Minutes of Exercise per Session: Not on file  Stress:   . Feeling of Stress : Not on file  Social Connections:   . Frequency of Communication with Friends and Family: Not on file  . Frequency of Social Gatherings with Friends and Family: Not on file  . Attends  Religious Services: Not on file  . Active Member of Clubs or Organizations: Not on file  . Attends Archivist Meetings: Not on file  . Marital Status: Not on file  Intimate Partner Violence:   . Fear of Current or Ex-Partner: Not on file  . Emotionally Abused: Not on file  . Physically Abused: Not on file  . Sexually Abused: Not on file     PHYSICAL EXAM  GENERAL EXAM/CONSTITUTIONAL: Vitals:  Vitals:   03/17/20 1115  BP: 122/73  Pulse: 68  Weight: 14 lb (6.35 kg)  Height: 5\' 3"  (1.6 m)     Body mass index is 2.48 kg/m. Wt Readings from Last 3 Encounters:  03/17/20 14 lb (6.35 kg)     Patient is in no distress; well developed, nourished and groomed; neck is supple  CARDIOVASCULAR:  Examination of carotid arteries is normal; no carotid bruits  Regular rate and rhythm, no murmurs  Examination of peripheral vascular system by observation and palpation is normal  EYES:  Ophthalmoscopic exam of optic discs and posterior segments is normal; no papilledema or hemorrhages  No exam data present  MUSCULOSKELETAL:  Gait, strength, tone, movements noted in Neurologic exam below  NEUROLOGIC: MENTAL STATUS:  MMSE - [place] Exam 03/17/2020  Orientation to time 3  Orientation to Place 5  Registration 3  Attention/ Calculation 0  Recall 2  Language- name 2 objects 2  Language- repeat 0  Language- follow 3 step command 3  Language- read & follow direction 1  Write a sentence 1  Copy design 0  Total score 20    awake, alert, oriented to person, place and time  recent and remote memory intact  normal attention and concentration  language fluent, comprehension intact, naming intact  fund of knowledge appropriate  CRANIAL NERVE:   2nd - no papilledema on fundoscopic exam  2nd, 3rd, 4th, 6th - pupils equal and reactive to light, visual fields full to confrontation, extraocular muscles intact, no nystagmus  5th - facial sensation  symmetric  7th - facial strength symmetric  8th - hearing intact  9th - palate elevates symmetrically, uvula midline  11th - shoulder shrug symmetric  12th - tongue protrusion midline  MOTOR:   normal bulk and tone, full strength in the BUE, BLE  SENSORY:   normal and symmetric to light touch, temperature, vibration  COORDINATION:   finger-nose-finger, fine finger movements normal  REFLEXES:   deep tendon reflexes present and symmetric  GAIT/STATION:   narrow based gait    DIAGNOSTIC DATA (LABS, IMAGING, TESTING) - I reviewed patient records, labs, notes, testing and imaging myself where available.  No  results found for: WBC, HGB, HCT, MCV, PLT No results found for: NA, K, CL, CO2, GLUCOSE, BUN, CREATININE, CALCIUM, PROT, ALBUMIN, AST, ALT, ALKPHOS, BILITOT, GFRNONAA, GFRAA No results found for: CHOL, HDL, LDLCALC, LDLDIRECT, TRIG, CHOLHDL No results found for: HGBA1C No results found for: VITAMINB12 No results found for: TSH   08/17/19 MRI brain [I reviewed images myself and agree with interpretation. -VRP]  1. No acute intracranial abnormality. 2. Mild nonspecific white matter hyperintensity, most commonly indicating chronic small vessel ischemia.  TSH 1.08 A1c 4.8    ASSESSMENT AND PLAN  69 y.o. year old female here with:  Dx:  1. Memory loss   2. MCI (mild cognitive impairment)      PLAN:  MEMORY LOSS / MILD COGNITIVE IMPAIRMENT - consider memantine 10mg  at bedtime; increase to twice a day after 1-2 weeks - safety / supervision issues reviewed - daily physical activity / exercise (at least 15-30 minutes) - eat more plants / vegetables - increase social activities, brain stimulation, games, puzzles, hobbies, crafts, arts, music - aim for at least 7-8 hours sleep per night (or more) - avoid smoking and alcohol - caution with driving and finances  Return for pending if symptoms worsen or fail to improve.    Penni Bombard, MD  57/0/1779, 39:03 AM Certified in Neurology, Neurophysiology and Neuroimaging  [facility] [address], [place], [place] [address] [phone]

## 2020-03-20 DIAGNOSIS — Z1231 Encounter for screening mammogram for malignant neoplasm of breast: Secondary | ICD-10-CM | POA: Diagnosis not present

## 2020-04-23 DIAGNOSIS — E7800 Pure hypercholesterolemia, unspecified: Secondary | ICD-10-CM | POA: Diagnosis not present

## 2020-04-23 DIAGNOSIS — M859 Disorder of bone density and structure, unspecified: Secondary | ICD-10-CM | POA: Diagnosis not present

## 2020-04-30 DIAGNOSIS — H04129 Dry eye syndrome of unspecified lacrimal gland: Secondary | ICD-10-CM | POA: Diagnosis not present

## 2020-04-30 DIAGNOSIS — A6000 Herpesviral infection of urogenital system, unspecified: Secondary | ICD-10-CM | POA: Diagnosis not present

## 2020-04-30 DIAGNOSIS — F321 Major depressive disorder, single episode, moderate: Secondary | ICD-10-CM | POA: Diagnosis not present

## 2020-04-30 DIAGNOSIS — D696 Thrombocytopenia, unspecified: Secondary | ICD-10-CM | POA: Diagnosis not present

## 2020-04-30 DIAGNOSIS — G3184 Mild cognitive impairment, so stated: Secondary | ICD-10-CM | POA: Diagnosis not present

## 2020-04-30 DIAGNOSIS — R82998 Other abnormal findings in urine: Secondary | ICD-10-CM | POA: Diagnosis not present

## 2020-04-30 DIAGNOSIS — M8580 Other specified disorders of bone density and structure, unspecified site: Secondary | ICD-10-CM | POA: Diagnosis not present

## 2020-04-30 DIAGNOSIS — Z0000 Encounter for general adult medical examination without abnormal findings: Secondary | ICD-10-CM | POA: Diagnosis not present

## 2020-04-30 DIAGNOSIS — Z8659 Personal history of other mental and behavioral disorders: Secondary | ICD-10-CM | POA: Diagnosis not present

## 2020-04-30 DIAGNOSIS — E7800 Pure hypercholesterolemia, unspecified: Secondary | ICD-10-CM | POA: Diagnosis not present

## 2020-04-30 DIAGNOSIS — R413 Other amnesia: Secondary | ICD-10-CM | POA: Diagnosis not present

## 2020-06-05 DIAGNOSIS — Z7689 Persons encountering health services in other specified circumstances: Secondary | ICD-10-CM | POA: Diagnosis not present

## 2020-06-16 ENCOUNTER — Other Ambulatory Visit: Payer: Self-pay

## 2020-06-16 ENCOUNTER — Ambulatory Visit (INDEPENDENT_AMBULATORY_CARE_PROVIDER_SITE_OTHER): Payer: Medicare Other | Admitting: Diagnostic Neuroimaging

## 2020-06-16 ENCOUNTER — Encounter: Payer: Self-pay | Admitting: Diagnostic Neuroimaging

## 2020-06-16 VITALS — BP 114/74 | HR 79 | Ht 63.0 in | Wt 108.6 lb

## 2020-06-16 DIAGNOSIS — F0390 Unspecified dementia without behavioral disturbance: Secondary | ICD-10-CM

## 2020-06-16 DIAGNOSIS — F03A0 Unspecified dementia, mild, without behavioral disturbance, psychotic disturbance, mood disturbance, and anxiety: Secondary | ICD-10-CM

## 2020-06-16 MED ORDER — MEMANTINE HCL 10 MG PO TABS: 10.0000 mg | ORAL_TABLET | Freq: Two times a day (BID) | ORAL | 12 refills | Status: DC

## 2020-06-16 NOTE — Progress Notes (Signed)
[facility]  PATIENT: Jennifer Mccall DOB: [birth year]  REFERRING CLINICIAN: Tisovec, Fransico Him, MD HISTORY FROM: patient  REASON FOR VISIT: follow up   HISTORICAL  CHIEF COMPLAINT:  Chief Complaint  Patient presents with   Memory Loss    Rm 7 FU requested due to increased memory loss, cognitive issues, husband- Bill MMSE 17    HISTORY OF PRESENT ILLNESS:   UPDATE (06/16/20, VRP): Since last visit, memory loss is progressing.  Now having difficulty with driving directions and got lost in December 2021.  She also having difficulty making her bed, doing chores in the kitchen, keeping track of dates and times.  She also has had decreased appetite and weight loss.  Husband feels like symptoms are progressing over the past few months.  Patient has noticed this as well.  Patient was having some more issues with pacing and anxiety, tried sertraline from PCP but could not tolerate this within 1 or 2 weeks.  PRIOR HPI: 70 year old female here for evaluation memory loss.  March 2021 patient was visiting the beach on vacation when she woke up in the middle night with some confusion lasting 1 to 2 minutes.  She was trying to use her phone as a television remote.  She was having trouble getting her words out.  She called to her husband who is in the next room.  Symptoms rapidly improved.  Symptoms have continued since that time and are worse with stress.  She is having difficulty with numbers, spelling, words, technology, driving directions.  Patient able to maintain most of her ADLs.  Patient is concerned about dementia due to family history.  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   REVIEW OF SYSTEMS: Full 14 system review of systems performed and negative with exception of: As per HPI.  ALLERGIES: Allergies  Allergen Reactions   Codeine Anaphylaxis   Demerol [Meperidine Hcl] Nausea And Vomiting    Erythromycin Base Hives   Ether Nausea And Vomiting    HOME MEDICATIONS: Outpatient Medications Prior to Visit  Medication Sig Dispense Refill   acyclovir (ZOVIRAX) 200 MG capsule      cycloSPORINE (RESTASIS) 0.05 % ophthalmic emulsion Place 1 drop into both eyes 2 times daily.     doxycycline (MONODOX) 50 MG capsule Take 50 mg by mouth daily.     Multiple Vitamin (QUINTABS) TABS Take 1 tablet by mouth daily.     No facility-administered medications prior to visit.    PAST MEDICAL HISTORY: Past Medical History:  Diagnosis Date   Anorexia    H/O   Dry eye    Dyslipidemia    Hypotension, unspecified    Lightheadedness    Memory impairment    Osteopenia    Urinary retention with incomplete bladder emptying    Weight loss, unintentional     PAST SURGICAL HISTORY: Past Surgical History:  Procedure Laterality Date   ABDOMINAL HYSTERECTOMY     APPENDECTOMY      FAMILY HISTORY: Family History  Problem Relation Age of Onset   Glaucoma Mother    Dementia Mother    Alcoholism Father    Lung cancer Father    Colon cancer Maternal Grandmother    Heart attack Paternal Grandfather    Prostate cancer Brother     SOCIAL HISTORY: Social History   Socioeconomic History   Marital status: Married    Spouse name: Rush Landmark   Number of children: 2  Years of education: Not on file   Highest education level: Master's degree (e.g., MA, MS, MEng, MEd, MSW, MBA)  Occupational History    Comment: retired Social worker  Tobacco Use   Smoking status: Never Smoker   Smokeless tobacco: Never Used  Substance and Sexual Activity   Alcohol use: Yes    Comment: 1 daily   Drug use: Never   Sexual activity: Not on file  Other Topics Concern   Not on file  Social History Narrative   Lives with husband   caffeine 1 a day   Social Determinants of Health   Financial Resource Strain: Not on file  Food Insecurity: Not on file  Transportation Needs: Not on  file  Physical Activity: Not on file  Stress: Not on file  Social Connections: Not on file  Intimate Partner Violence: Not on file     PHYSICAL EXAM  GENERAL EXAM/CONSTITUTIONAL: Vitals:  Vitals:   06/16/20 0843  BP: 114/74  Pulse: 79  Weight: 108 lb 9.6 oz (49.3 kg)  Height: 5\' 3"  (1.6 m)   Body mass index is 19.24 kg/m. Wt Readings from Last 3 Encounters:  06/16/20 108 lb 9.6 oz (49.3 kg)  03/17/20 14 lb (6.35 kg)    Patient is in no distress; well developed, nourished and groomed; neck is supple  CARDIOVASCULAR:  Examination of carotid arteries is normal; no carotid bruits  Regular rate and rhythm, no murmurs  Examination of peripheral vascular system by observation and palpation is normal  EYES:  Ophthalmoscopic exam of optic discs and posterior segments is normal; no papilledema or hemorrhages No exam data present  MUSCULOSKELETAL:  Gait, strength, tone, movements noted in Neurologic exam below  NEUROLOGIC: MENTAL STATUS:  MMSE - [place] Exam 06/16/2020 03/17/2020  Orientation to time 1 3  Orientation to Place 4 5  Registration 3 3  Attention/ Calculation 0 0  Recall 2 2  Language- name 2 objects 2 2  Language- repeat 0 0  Language- follow 3 step command 3 3  Language- read & follow direction 1 1  Write a sentence 1 1  Copy design 0 0  Total score 17 20    awake, alert, oriented to person, place and time  recent and remote memory intact  normal attention and concentration  language fluent, comprehension intact, naming intact  fund of knowledge appropriate  CRANIAL NERVE:   2nd - no papilledema on fundoscopic exam  2nd, 3rd, 4th, 6th - pupils equal and reactive to light, visual fields full to confrontation, extraocular muscles intact, no nystagmus  5th - facial sensation symmetric  7th - facial strength symmetric  8th - hearing intact  9th - palate elevates symmetrically, uvula midline  11th - shoulder shrug  symmetric  12th - tongue protrusion midline  MOTOR:   normal bulk and tone, full strength in the BUE, BLE  SENSORY:   normal and symmetric to light touch, temperature, vibration  COORDINATION:   finger-nose-finger, fine finger movements normal  REFLEXES:   deep tendon reflexes present and symmetric  GAIT/STATION:   narrow based gait    DIAGNOSTIC DATA (LABS, IMAGING, TESTING) - I reviewed patient records, labs, notes, testing and imaging myself where available.  No results found for: WBC, HGB, HCT, MCV, PLT No results found for: NA, K, CL, CO2, GLUCOSE, BUN, CREATININE, CALCIUM, PROT, ALBUMIN, AST, ALT, ALKPHOS, BILITOT, GFRNONAA, GFRAA No results found for: CHOL, HDL, LDLCALC, LDLDIRECT, TRIG, CHOLHDL No results found for: HGBA1C No results found  for: VITAMINB12 No results found for: TSH   08/17/19 MRI brain [I reviewed images myself and agree with interpretation. -VRP]  1. No acute intracranial abnormality. 2. Mild nonspecific white matter hyperintensity, most commonly indicating chronic small vessel ischemia.  TSH 1.08 A1c 4.8    ASSESSMENT AND PLAN  70 y.o. year old female here with:  Dx:  1. Mild dementia ([place])      PLAN:  MEMORY LOSS / MILD DEMENTIA (gradual progressive symptoms since March 2021, decline in ADLs, most consistent with mild neurodegenerative dementia) - start memantine 10mg  at bedtime; increase to twice a day after 1-2 weeks - consider sertraline 25mg  daily - caregiver resources provided - safety / supervision issues reviewed - daily physical activity / exercise (at least 15-30 minutes) - eat more plants / vegetables - increase social activities, brain stimulation, games, puzzles, hobbies, crafts, arts, music - aim for at least 7-8 hours sleep per night (or more) - avoid smoking and alcohol - no driving; caution with finances and medications  Meds ordered this encounter  Medications   memantine (NAMENDA) 10 MG tablet    Sig:  Take 1 tablet (10 mg total) by mouth 2 (two) times daily.    Dispense:  60 tablet    Refill:  12   Return for pending if symptoms worsen or fail to improve. follow up as needed    Penni Bombard, MD [id number], [id number] AM Certified in Neurology, Neurophysiology and Neuroimaging  [facility] [address] [place], [place] [address] [phone]

## 2020-06-16 NOTE — Patient Instructions (Signed)
MEMORY LOSS / MILD DEMENTIA - start memantine 10mg  at bedtime; increase to twice a day after 1-2 weeks - consider sertraline 25mg  daily - safety / supervision issues reviewed - daily physical activity / exercise (at least 15-30 minutes) - eat more plants / vegetables - increase social activities, brain stimulation, games, puzzles, hobbies, crafts, arts, music - aim for at least 7-8 hours sleep per night (or more) - avoid smoking and alcohol - no driving; caution with finances and medications

## 2020-07-29 DIAGNOSIS — H16223 Keratoconjunctivitis sicca, not specified as Sjogren's, bilateral: Secondary | ICD-10-CM | POA: Diagnosis not present

## 2020-07-29 DIAGNOSIS — L718 Other rosacea: Secondary | ICD-10-CM | POA: Diagnosis not present

## 2020-07-29 DIAGNOSIS — H0288A Meibomian gland dysfunction right eye, upper and lower eyelids: Secondary | ICD-10-CM | POA: Diagnosis not present

## 2020-07-29 DIAGNOSIS — H2513 Age-related nuclear cataract, bilateral: Secondary | ICD-10-CM | POA: Diagnosis not present

## 2020-07-29 DIAGNOSIS — H0288B Meibomian gland dysfunction left eye, upper and lower eyelids: Secondary | ICD-10-CM | POA: Diagnosis not present

## 2020-09-08 DIAGNOSIS — D225 Melanocytic nevi of trunk: Secondary | ICD-10-CM | POA: Diagnosis not present

## 2020-09-08 DIAGNOSIS — L814 Other melanin hyperpigmentation: Secondary | ICD-10-CM | POA: Diagnosis not present

## 2020-09-08 DIAGNOSIS — L718 Other rosacea: Secondary | ICD-10-CM | POA: Diagnosis not present

## 2020-09-08 DIAGNOSIS — L821 Other seborrheic keratosis: Secondary | ICD-10-CM | POA: Diagnosis not present

## 2020-09-08 DIAGNOSIS — D224 Melanocytic nevi of scalp and neck: Secondary | ICD-10-CM | POA: Diagnosis not present

## 2020-09-08 DIAGNOSIS — Z85828 Personal history of other malignant neoplasm of skin: Secondary | ICD-10-CM | POA: Diagnosis not present

## 2020-09-25 DIAGNOSIS — J069 Acute upper respiratory infection, unspecified: Secondary | ICD-10-CM | POA: Diagnosis not present

## 2020-09-25 DIAGNOSIS — Z1152 Encounter for screening for COVID-19: Secondary | ICD-10-CM | POA: Diagnosis not present

## 2020-09-25 DIAGNOSIS — R059 Cough, unspecified: Secondary | ICD-10-CM | POA: Diagnosis not present

## 2020-10-02 DIAGNOSIS — H2513 Age-related nuclear cataract, bilateral: Secondary | ICD-10-CM | POA: Diagnosis not present

## 2020-10-08 DIAGNOSIS — H52202 Unspecified astigmatism, left eye: Secondary | ICD-10-CM | POA: Diagnosis not present

## 2020-10-08 DIAGNOSIS — F0390 Unspecified dementia without behavioral disturbance: Secondary | ICD-10-CM | POA: Diagnosis not present

## 2020-10-08 DIAGNOSIS — H25812 Combined forms of age-related cataract, left eye: Secondary | ICD-10-CM | POA: Diagnosis not present

## 2020-10-22 DIAGNOSIS — H25811 Combined forms of age-related cataract, right eye: Secondary | ICD-10-CM | POA: Diagnosis not present

## 2020-10-22 DIAGNOSIS — H52201 Unspecified astigmatism, right eye: Secondary | ICD-10-CM | POA: Diagnosis not present

## 2020-12-26 DIAGNOSIS — W268XXA Contact with other sharp object(s), not elsewhere classified, initial encounter: Secondary | ICD-10-CM | POA: Diagnosis not present

## 2020-12-26 DIAGNOSIS — Z23 Encounter for immunization: Secondary | ICD-10-CM | POA: Diagnosis not present

## 2020-12-26 DIAGNOSIS — S8992XA Unspecified injury of left lower leg, initial encounter: Secondary | ICD-10-CM | POA: Diagnosis not present

## 2021-03-06 IMAGING — MR MR HEAD WO/W CM
14 of 16 series · 40 of 48 positions shown · IV contrast (gadavist)
Comparison: None.

CLINICAL DATA: Altered mental status. Speech and writing
difficulty.

EXAM:
MRI HEAD WITHOUT AND WITH CONTRAST
TECHNIQUE: Multiplanar, multiecho pulse sequences of the brain and surrounding
structures were obtained without and with intravenous contrast.
CONTRAST:  5mL GADAVIST GADOBUTROL 1 MMOL/ML IV SOLN

[Series 5: DWI · axial · 3.0mm · 0.88mm/px · z∈[-110,+23]mm · 5 of 94 slices shown (1 of 4)]
[im 1/94]
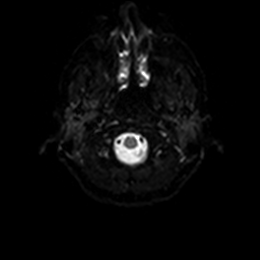
[im 24/94]
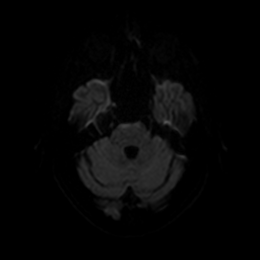
[im 47/94]
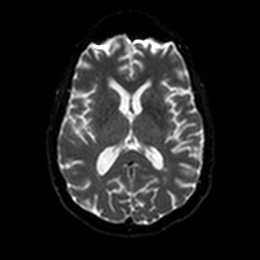
[im 70/94]
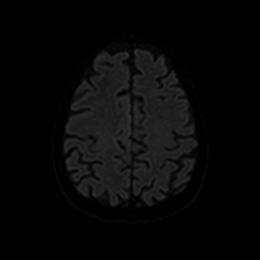
[im 94/94]
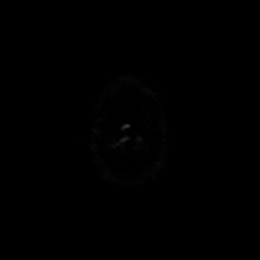

[Series 6: DWI · axial · 3.0mm · 0.88mm/px · z∈[-110,+23]mm · 2 of 47 slices shown (2 of 4)]
[im 1/47]
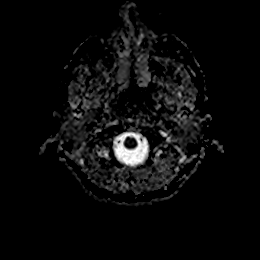
[im 47/47]
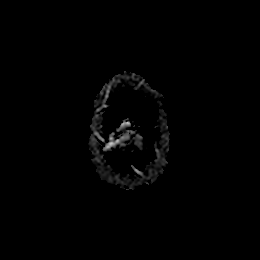

[Series 7: DWI · coronal · 4.0mm · 0.88mm/px · 4 of 72 slices shown (3 of 4)]
[im 1/72]
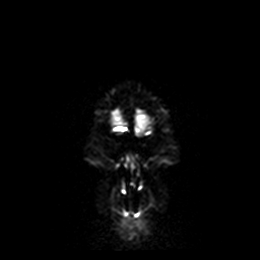
[im 24/72]
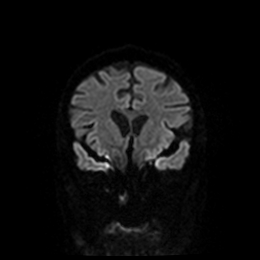
[im 48/72]
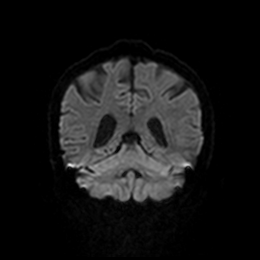
[im 72/72]
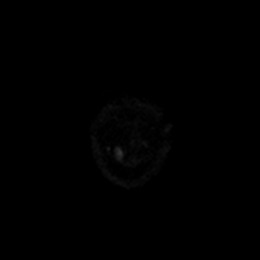

[Series 8: DWI · coronal · 4.0mm · 0.88mm/px · 2 of 36 slices shown (4 of 4)]
[im 1/36]
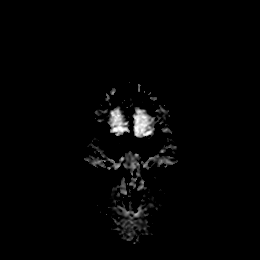
[im 36/36]
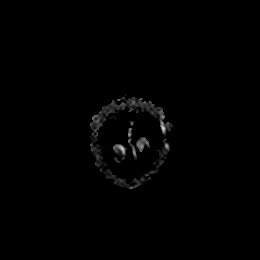

[Series 9: T1 · sagittal · 5.0mm · 0.75mm/px · 2 of 23 slices shown]
[im 1/23]
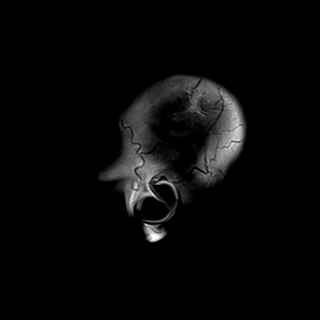
[im 23/23]
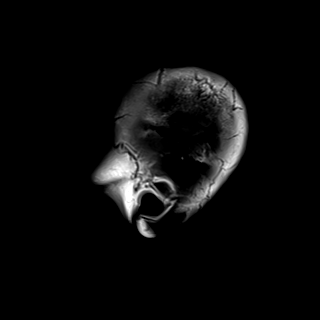

[Series 10: T2 · axial · 5.0mm · 0.72mm/px · z∈[-113,+26]mm · 2 of 25 slices shown]
[im 1/25]
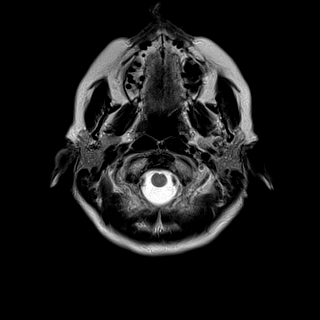
[im 25/25]
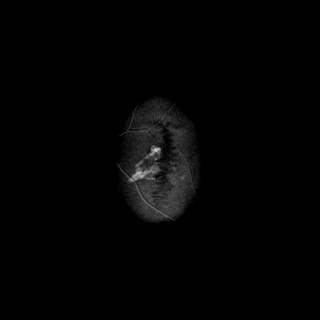

[Series 11: FLAIR · axial · 5.0mm · 0.45mm/px · z∈[-113,+26]mm · 2 of 25 slices shown]
[im 1/25]
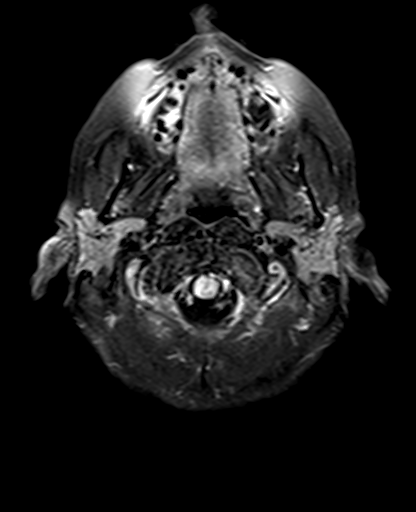
[im 25/25]
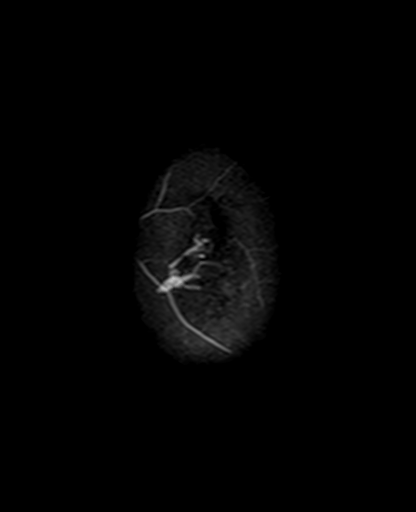

[Series 12: mag_images · axial · 3.0mm · 0.90mm/px · z∈[-123,+36]mm · 4 of 56 slices shown]
[im 1/56]
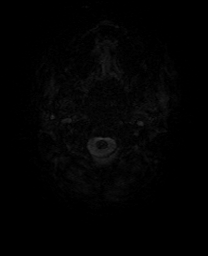
[im 19/56]
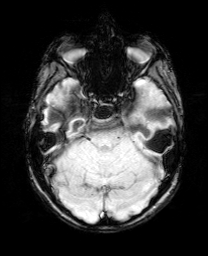
[im 37/56]
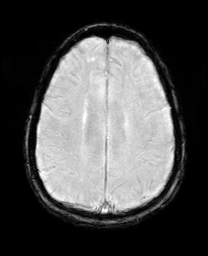
[im 56/56]
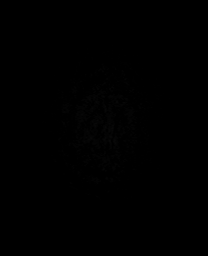

[Series 13: pha_images · axial · 3.0mm · 0.90mm/px · z∈[-123,+30]mm · 4 of 54 slices shown]
[im 1/54]
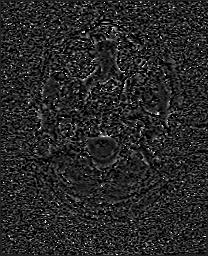
[im 18/54]
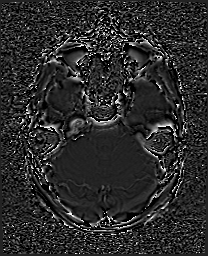
[im 36/54]
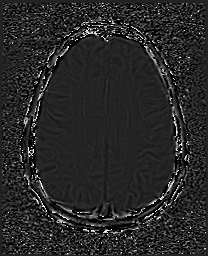
[im 54/54]
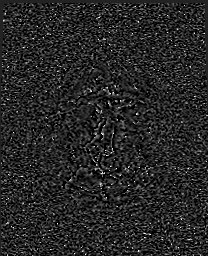

[Series 14: swi_images · axial · 3.0mm · 0.90mm/px · z∈[-123,+36]mm · 4 of 56 slices shown]
[im 1/56]
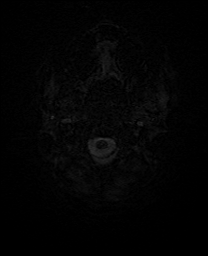
[im 19/56]
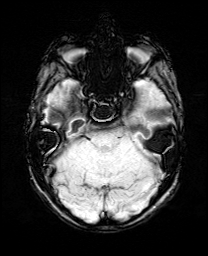
[im 37/56]
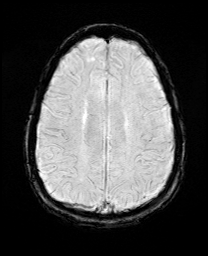
[im 56/56]
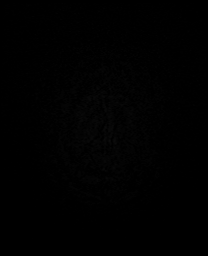

[Series 15: mip_images(sw) · axial · 24.0mm · 0.90mm/px · z∈[-113,+26]mm · 3 of 49 slices shown]
[im 1/49]
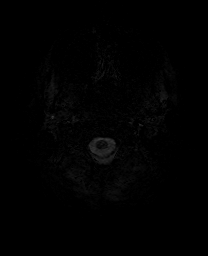
[im 25/49]
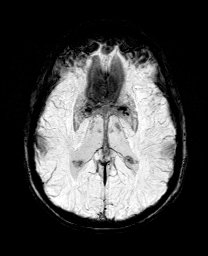
[im 49/49]
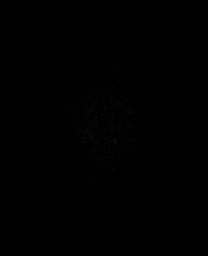

[Series 17: T2 post-contrast · coronal · 5.0mm · 0.72mm/px · 2 of 31 slices shown]
[im 1/31]
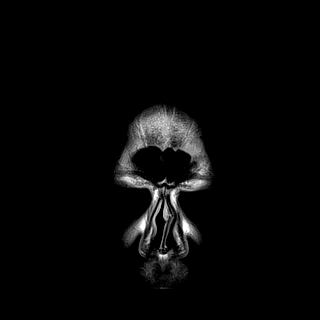
[im 31/31]
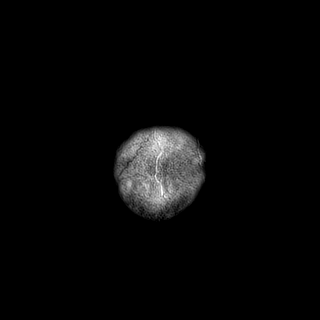

[Series 19: T1 post-contrast · coronal · 5.0mm · 0.34mm/px · 2 of 31 slices shown (1 of 2)]
[im 1/31]
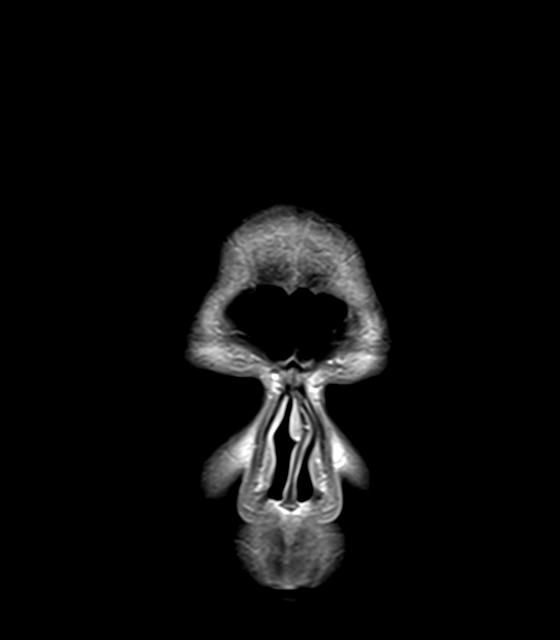
[im 31/31]
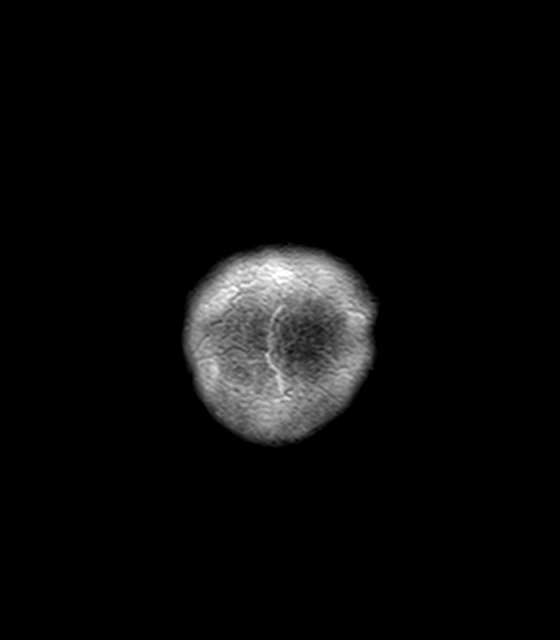

[Series 20: T1 post-contrast · sagittal · 5.0mm · 0.72mm/px · 2 of 23 slices shown (2 of 2)]
[im 1/23]
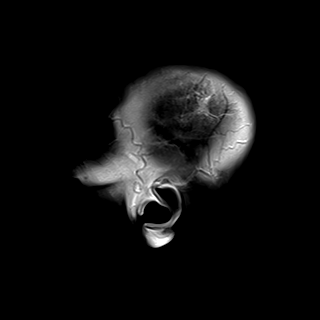
[im 23/23]
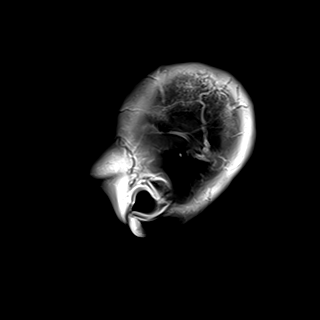

[40 of 48 positions shown; findings below may reference images not displayed]

FINDINGS: BRAIN: No acute infarct, acute hemorrhage or extra-axial collection.
Multifocal white matter hyperintensity, most commonly due to chronic
ischemic microangiopathy. Normal volume of brain parenchyma and CSF
spaces. Midline structures are normal. There is no abnormal contrast
enhancement.

VASCULAR: Major flow voids are preserved. Susceptibility-sensitive
sequences show no chronic microhemorrhage or superficial siderosis.

SKULL AND UPPER CERVICAL SPINE: Normal calvarium and skull base.
Visualized upper cervical spine and soft tissues are normal.

SINUSES/ORBITS: No paranasal sinus fluid levels or advanced mucosal
thickening. No mastoid or middle ear effusion. Normal orbits.
IMPRESSION: 1. No acute intracranial abnormality.
2. Mild nonspecific white matter hyperintensity, most commonly
indicating chronic small vessel ischemia.

## 2021-04-27 DIAGNOSIS — M859 Disorder of bone density and structure, unspecified: Secondary | ICD-10-CM | POA: Diagnosis not present

## 2021-04-27 DIAGNOSIS — E7800 Pure hypercholesterolemia, unspecified: Secondary | ICD-10-CM | POA: Diagnosis not present

## 2021-05-04 DIAGNOSIS — Z0000 Encounter for general adult medical examination without abnormal findings: Secondary | ICD-10-CM | POA: Diagnosis not present

## 2021-05-04 DIAGNOSIS — G3184 Mild cognitive impairment, so stated: Secondary | ICD-10-CM | POA: Diagnosis not present

## 2021-05-04 DIAGNOSIS — M8580 Other specified disorders of bone density and structure, unspecified site: Secondary | ICD-10-CM | POA: Diagnosis not present

## 2021-05-04 DIAGNOSIS — A6000 Herpesviral infection of urogenital system, unspecified: Secondary | ICD-10-CM | POA: Diagnosis not present

## 2021-05-04 DIAGNOSIS — Z8659 Personal history of other mental and behavioral disorders: Secondary | ICD-10-CM | POA: Diagnosis not present

## 2021-05-04 DIAGNOSIS — F321 Major depressive disorder, single episode, moderate: Secondary | ICD-10-CM | POA: Diagnosis not present

## 2021-05-04 DIAGNOSIS — Z1339 Encounter for screening examination for other mental health and behavioral disorders: Secondary | ICD-10-CM | POA: Diagnosis not present

## 2021-05-04 DIAGNOSIS — E871 Hypo-osmolality and hyponatremia: Secondary | ICD-10-CM | POA: Diagnosis not present

## 2021-05-04 DIAGNOSIS — Z1331 Encounter for screening for depression: Secondary | ICD-10-CM | POA: Diagnosis not present

## 2021-05-04 DIAGNOSIS — E7800 Pure hypercholesterolemia, unspecified: Secondary | ICD-10-CM | POA: Diagnosis not present

## 2021-05-04 DIAGNOSIS — D696 Thrombocytopenia, unspecified: Secondary | ICD-10-CM | POA: Diagnosis not present

## 2021-05-26 DIAGNOSIS — H0288B Meibomian gland dysfunction left eye, upper and lower eyelids: Secondary | ICD-10-CM | POA: Diagnosis not present

## 2021-05-26 DIAGNOSIS — H26493 Other secondary cataract, bilateral: Secondary | ICD-10-CM | POA: Diagnosis not present

## 2021-05-26 DIAGNOSIS — H0288A Meibomian gland dysfunction right eye, upper and lower eyelids: Secondary | ICD-10-CM | POA: Diagnosis not present

## 2021-05-26 DIAGNOSIS — L718 Other rosacea: Secondary | ICD-10-CM | POA: Diagnosis not present

## 2021-05-26 DIAGNOSIS — Z9841 Cataract extraction status, right eye: Secondary | ICD-10-CM | POA: Diagnosis not present

## 2021-05-26 DIAGNOSIS — H16223 Keratoconjunctivitis sicca, not specified as Sjogren's, bilateral: Secondary | ICD-10-CM | POA: Diagnosis not present

## 2021-05-26 DIAGNOSIS — Z961 Presence of intraocular lens: Secondary | ICD-10-CM | POA: Diagnosis not present

## 2021-05-26 DIAGNOSIS — Z9842 Cataract extraction status, left eye: Secondary | ICD-10-CM | POA: Diagnosis not present

## 2021-06-08 DIAGNOSIS — Z1231 Encounter for screening mammogram for malignant neoplasm of breast: Secondary | ICD-10-CM | POA: Diagnosis not present

## 2021-06-24 ENCOUNTER — Encounter (HOSPITAL_COMMUNITY): Payer: Self-pay | Admitting: Emergency Medicine

## 2021-06-24 ENCOUNTER — Encounter (HOSPITAL_BASED_OUTPATIENT_CLINIC_OR_DEPARTMENT_OTHER): Payer: Self-pay | Admitting: Emergency Medicine

## 2021-06-24 ENCOUNTER — Emergency Department (HOSPITAL_BASED_OUTPATIENT_CLINIC_OR_DEPARTMENT_OTHER)
Admission: EM | Admit: 2021-06-24 | Discharge: 2021-06-24 | Disposition: A | Discharge status: Home / Self Care | Payer: Medicare Other | Attending: Emergency Medicine | Admitting: Emergency Medicine

## 2021-06-24 ENCOUNTER — Other Ambulatory Visit: Payer: Self-pay

## 2021-06-24 DIAGNOSIS — K5900 Constipation, unspecified: Secondary | ICD-10-CM | POA: Diagnosis not present

## 2021-06-24 MED ORDER — DOCUSATE SODIUM 100 MG PO CAPS: 100.0000 mg | ORAL_CAPSULE | Freq: Two times a day (BID) | ORAL | 0 refills | Status: DC

## 2021-06-24 MED ORDER — POLYETHYLENE GLYCOL 3350 17 G PO PACK: 17.0000 g | PACK | Freq: Two times a day (BID) | ORAL | 0 refills | Status: DC

## 2021-06-24 NOTE — ED Provider Notes (Signed)
[facility] EMERGENCY DEPT Provider Note   CSN: [id number] Arrival date & time: 06/24/21  6948     History  Chief Complaint  Patient presents with   Constipation    Jennifer Mccall is a 71 y.o. female.  HPI 71 year old female presents with constipation.  She enlist her husband to help with the history due to some memory issues.  He reports she has been unable to have a significant bowel movement for about a week.  She states this might be more to 2 weeks.  Patient seems to initially indicate that she does not have constipation problems but it seems like she does seem to have waxing and waning issues.  A couple weeks ago she tried some Senokot but recently they have not tried any medicines.  She has a lot of gas and a lot of urge to go have a bowel movement but at times only very small pieces will come out.  She states that this morning when she tried to force stool out she got a couple drops of blood which made her concerned.  There is no abdominal pain, vomiting, fever.  No significant rectal pain, just an urge to go.  Home Medications Prior to Admission medications   Medication Sig Start Date End Date Taking? Authorizing Provider  docusate sodium (COLACE) 100 MG capsule Take 1 capsule (100 mg total) by mouth every 12 (twelve) hours. 06/24/21  Yes Sherwood Gambler, MD  polyethylene glycol (MIRALAX / GLYCOLAX) 17 g packet Take 17 g by mouth 2 (two) times daily. 06/24/21  Yes Sherwood Gambler, MD  acyclovir (ZOVIRAX) 200 MG capsule  01/21/20   [provider]  cycloSPORINE (RESTASIS) 0.05 % ophthalmic emulsion Place 1 drop into both eyes 2 times daily. 08/20/19   [provider]  doxycycline (MONODOX) 50 MG capsule Take 50 mg by mouth daily. 01/23/20   [provider]  memantine (NAMENDA) 10 MG tablet Take 1 tablet (10 mg total) by mouth 2 (two) times daily. 06/16/20   Penumalli, Earlean Polka, MD  Multiple Vitamin (QUINTABS) TABS Take 1 tablet by mouth daily.     [provider]      Allergies    Codeine, Demerol [meperidine hcl], Erythromycin base, and Ether    Review of Systems   Review of Systems  Constitutional:  Negative for fever.  Gastrointestinal:  Negative for abdominal pain, rectal pain and vomiting.   Physical Exam Updated Vital Signs BP (!) 153/81    Pulse 70    Temp 98.1 F (36.7 C) (Oral)    Resp 18    Ht 5\' 3"  (1.6 m)    Wt 46.3 kg    SpO2 100%    BMI 18.07 kg/m  Physical Exam Vitals and nursing note reviewed. Exam conducted with a chaperone present.  Constitutional:      Appearance: She is well-developed.  HENT:     Head: Normocephalic and atraumatic.  Pulmonary:     Effort: Pulmonary effort is normal.  Abdominal:     General: There is no distension.     Palpations: Abdomen is soft.     Tenderness: There is no abdominal tenderness.  Genitourinary:    Comments: Small non-thrombosed hemorrhoid/skin tag There is 1 small stool that is soft.  Otherwise there is minimal stool and certainly no impacted or hard stool.  No gross blood. Skin:    General: Skin is warm and dry.  Neurological:     Mental Status: She is alert.  ED Results / Procedures / Treatments   Labs (all labs ordered are listed, but only abnormal results are displayed) Labs Reviewed - No data to display  EKG None  Radiology No results found.  Procedures Procedures    Medications Ordered in ED Medications - No data to display  ED Course/ Medical Decision Making/ A&P                            Patient has a benign abdominal exam.  She has no abdominal pain or obstructive symptoms.  She is mildly hypertensive here but otherwise her vitals are okay.  I do not think labs are warranted.  Sounds like she has probably had constipation for a while but it is worse over the last week or 2.  On rectal exam there is no impaction and minimal stool.  I discussed options with patient including enema here versus enema at home and medication  adjustment such as Colace and MiraLAX.  She and husband prefer to go home.  I think this is reasonable, especially given no signs of impaction/obstruction.  She reports some mild blood when she was straining today but there is no blood seen and this was a one-time episode so I do not think labs are needed.  No indication for imaging.  We discussed increasing water and fiber intake.  At this point, I think she can be treated as an outpatient but we discussed return precautions.        Final Clinical Impression(s) / ED Diagnoses Final diagnoses:  Constipation, unspecified constipation type    Rx / DC Orders ED Discharge Orders          Ordered    polyethylene glycol (MIRALAX / GLYCOLAX) 17 g packet  2 times daily        06/24/21 0743    docusate sodium (COLACE) 100 MG capsule  Every 12 hours        06/24/21 0743              Sherwood Gambler, MD 06/24/21 [phone]

## 2021-06-24 NOTE — Discharge Instructions (Addendum)
If you develop abdominal pain, rectal bleeding, vomiting, fever, or any other new/concerning symptoms and return to the ER for evaluation.  Be sure to increase your water intake and make sure you are increasing your fiber intake. Titrate the miralax to your stools, if they become too soft or often then decrease to once per day

## 2021-06-24 NOTE — ED Triage Notes (Signed)
°  Patient comes in with constipation that has been going on for about 2 weeks.  Patient states she feels the urge to poop but nothing comes out.  Patient states she can force out some stool at times but notices blood when she wipes.  No hx of hemorrhoids or bowel obstruction.  Took senekot at home but did not help.  No pain, but states she has pressure on her rectum.

## 2021-07-01 DIAGNOSIS — R350 Frequency of micturition: Secondary | ICD-10-CM | POA: Diagnosis not present

## 2021-07-01 DIAGNOSIS — N3000 Acute cystitis without hematuria: Secondary | ICD-10-CM | POA: Diagnosis not present

## 2021-07-01 DIAGNOSIS — R300 Dysuria: Secondary | ICD-10-CM | POA: Diagnosis not present

## 2021-07-01 DIAGNOSIS — R3915 Urgency of urination: Secondary | ICD-10-CM | POA: Diagnosis not present

## 2021-07-09 DIAGNOSIS — H26491 Other secondary cataract, right eye: Secondary | ICD-10-CM | POA: Diagnosis not present

## 2021-09-07 ENCOUNTER — Ambulatory Visit (INDEPENDENT_AMBULATORY_CARE_PROVIDER_SITE_OTHER): Payer: Medicare Other | Admitting: Diagnostic Neuroimaging

## 2021-09-07 ENCOUNTER — Encounter: Payer: Self-pay | Admitting: Diagnostic Neuroimaging

## 2021-09-07 VITALS — BP 103/66 | HR 81 | Ht 64.0 in | Wt 104.4 lb

## 2021-09-07 DIAGNOSIS — F03A0 Unspecified dementia, mild, without behavioral disturbance, psychotic disturbance, mood disturbance, and anxiety: Secondary | ICD-10-CM | POA: Diagnosis not present

## 2021-09-07 DIAGNOSIS — R413 Other amnesia: Secondary | ICD-10-CM

## 2021-09-07 MED ORDER — SERTRALINE HCL 25 MG PO TABS: 25.0000 mg | ORAL_TABLET | Freq: Every day | ORAL | 12 refills | Status: DC

## 2021-09-07 MED ORDER — MEMANTINE HCL 10 MG PO TABS: 10.0000 mg | ORAL_TABLET | Freq: Two times a day (BID) | ORAL | 12 refills | Status: DC

## 2021-09-07 NOTE — Progress Notes (Signed)
? ?[facility] ? ?PATIENT: Jennifer Mccall ?DOB: [birth year] ? ?REFERRING CLINICIAN: Tisovec, Fransico Him, MD ?HISTORY FROM: patient  ?REASON FOR VISIT: follow up ? ? ?HISTORICAL ? ?CHIEF COMPLAINT:  ?Chief Complaint  ?Patient presents with  ? Dementia  ?  Rm 6, FU, husband- Bill  MMSE 17  ? ? ?HISTORY OF PRESENT ILLNESS:  ? ?UPDATE (09/07/21, VRP): Since last visit, memory loss continues. No alleviating or aggravating factors. Tolerating memantine. Some ongoing depression and mood changes. ? ?UPDATE (06/16/20, VRP): Since last visit, memory loss is progressing.  Now having difficulty with driving directions and got lost in December 2021.  She also having difficulty making her bed, doing chores in the kitchen, keeping track of dates and times.  She also has had decreased appetite and weight loss.  Husband feels like symptoms are progressing over the past few months.  Patient has noticed this as well.  Patient was having some more issues with pacing and anxiety, tried sertraline from PCP but could not tolerate this within 1 or 2 weeks. ? ?PRIOR HPI: 71 year old female here for evaluation memory loss. ? ?March 2021 patient was visiting the beach on vacation when she woke up in the middle night with some confusion lasting 1 to 2 minutes.  She was trying to use her phone as a television remote.  She was having trouble getting her words out.  She called to her husband who is in the next room.  Symptoms rapidly improved.  Symptoms have continued since that time and are worse with stress.  She is having difficulty with numbers, spelling, words, technology, driving directions.  Patient able to maintain most of her ADLs.  Patient is concerned about dementia due to family history. ? ?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 ? ? ?REVIEW OF SYSTEMS: Full 14 system review of systems performed and negative with exception of: As per  HPI. ? ?ALLERGIES: ?Allergies  ?Allergen Reactions  ? Codeine Anaphylaxis  ? Erythromycin Hives  ? Codeine   ? Demerol [Meperidine Hcl] Nausea And Vomiting  ? Demerol [Meperidine]   ? Erythorbic Acid   ? Erythromycin Base Hives  ? Ether Nausea And Vomiting  ? Tape   ?  Other reaction(s): Other (See Comments) ?Pt has thin skin, prefers paper tape  ? ? ?HOME MEDICATIONS: ?Outpatient Medications Prior to Visit  ?Medication Sig Dispense Refill  ? acyclovir (ZOVIRAX) 200 MG capsule TAKE ONE CAPSULE BY MOUTH TWICE A DAY 60 capsule 1  ? cycloSPORINE (RESTASIS) 0.05 % ophthalmic emulsion Place 1 drop into both eyes 2 times daily.    ? Multiple Vitamin (QUINTABS) TABS Take 1 tablet by mouth daily.    ? memantine (NAMENDA) 10 MG tablet Take 1 tablet (10 mg total) by mouth 2 (two) times daily. 60 tablet 12  ? acyclovir (ZOVIRAX) 200 MG capsule     ? aspirin 325 MG tablet Take 325 mg by mouth daily.    ? docusate sodium (COLACE) 100 MG capsule Take 1 capsule (100 mg total) by mouth every 12 (twelve) hours. 60 capsule 0  ? doxycycline (MONODOX) 50 MG capsule Take 50 mg by mouth daily.    ? polyethylene glycol (MIRALAX / GLYCOLAX) 17 g packet Take 17 g by mouth 2 (two) times daily. 14 each 0  ? ?No facility-administered medications prior to visit.  ? ? ?PAST MEDICAL HISTORY: ?Past Medical History:  ?Diagnosis Date  ? Anorexia   ?  H/O  ? Anxiety   ? Dry eye   ? Dyslipidemia   ? Hyperlipidemia   ? Hypotension, unspecified   ? Lightheadedness   ? Memory impairment   ? Osteopenia   ? Urinary retention with incomplete bladder emptying   ? Weight loss, unintentional   ? ? ?PAST SURGICAL HISTORY: ?Past Surgical History:  ?Procedure Laterality Date  ? ABDOMINAL HYSTERECTOMY    ? APPENDECTOMY    ? CATARACT EXTRACTION Bilateral   ? ? ?FAMILY HISTORY: ?Family History  ?Problem Relation Age of Onset  ? Glaucoma Mother   ? Dementia Mother   ? Alcoholism Father   ? Lung cancer Father   ? Colon cancer Maternal Grandmother   ? Heart attack  Paternal Grandfather   ? Prostate cancer Brother   ? Cancer - Lung Father   ? ? ?SOCIAL HISTORY: ?Social History  ? ?Socioeconomic History  ? Marital status: Married  ?  Spouse name: Rush Landmark  ? Number of children: 2  ? Years of education: Not on file  ? Highest education level: Master's degree (e.g., MA, MS, MEng, MEd, MSW, MBA)  ?Occupational History  ? Occupation: Counselor  ?  Employer: [employer]  ?  Comment: retired Social worker  ?Tobacco Use  ? Smoking status: Never  ? Smokeless tobacco: Never  ?Vaping Use  ? Vaping Use: Never used  ?Substance and Sexual Activity  ? Alcohol use: Yes  ?  Comment: 1 daily  ? Drug use: Never  ? Sexual activity: Not on file  ?Other Topics Concern  ? Not on file  ?Social History Narrative  ? 09/07/21 Lives with husband  ? caffeine 1 a day  ? ?Social Determinants of Health  ? ?Financial Resource Strain: Not on file  ?Food Insecurity: Not on file  ?Transportation Needs: Not on file  ?Physical Activity: Not on file  ?Stress: Not on file  ?Social Connections: Not on file  ?Intimate Partner Violence: Not on file  ? ? ? ?PHYSICAL EXAM ? ?GENERAL EXAM/CONSTITUTIONAL: ?Vitals:  ?Vitals:  ? 09/07/21 1556  ?BP: 103/66  ?Pulse: 81  ?Weight: 104 lb 6.4 oz (47.4 kg)  ?Height: '5\' 4"'$  (1.626 m)  ? ?Body mass index is 17.92 kg/m?. ?Wt Readings from Last 3 Encounters:  ?09/07/21 104 lb 6.4 oz (47.4 kg)  ?06/24/21 102 lb (46.3 kg)  ?06/16/20 108 lb 9.6 oz (49.3 kg)  ? ?Patient is in no distress; well developed, nourished and groomed; neck is supple ? ?CARDIOVASCULAR: ?Examination of carotid arteries is normal; no carotid bruits ?Regular rate and rhythm, no murmurs ?Examination of peripheral vascular system by observation and palpation is normal ? ?EYES: ?Ophthalmoscopic exam of optic discs and posterior segments is normal; no papilledema or hemorrhages ?No results found. ? ?MUSCULOSKELETAL: ?Gait, strength, tone, movements noted in Neurologic exam below ? ?NEUROLOGIC: ?MENTAL  STATUS:  ? ?  09/07/2021  ?  4:01 PM 06/16/2020  ?  8:51 AM 03/17/2020  ? 11:25 AM  ?MMSE - Mini Mental State Exam  ?Orientation to time '1 1 3  '$ ?Orientation to Place '4 4 5  '$ ?Registration '3 3 3  '$ ?Attention/ Calculation 0 0 0  ?Recall '2 2 2  '$ ?Language- name 2 objects '2 2 2  '$ ?Language- repeat 0 0 0  ?Language- follow 3 step command '3 3 3  '$ ?Language- read & follow direction '1 1 1  '$ ?Write a sentence '1 1 1  '$ ?Copy design 0 0 0  ?Total score '17 17 20  '$ ? ?  awake, alert, oriented to person ?recent and remote memory intact ?normal attention and concentration ?language fluent, comprehension intact, naming intact ?fund of knowledge appropriate ? ?CRANIAL NERVE:  ?2nd - no papilledema on fundoscopic exam ?2nd, 3rd, 4th, 6th - pupils equal and reactive to light, visual fields full to confrontation, extraocular muscles intact, no nystagmus ?5th - facial sensation symmetric ?7th - facial strength symmetric ?8th - hearing intact ?9th - palate elevates symmetrically, uvula midline ?11th - shoulder shrug symmetric ?12th - tongue protrusion midline ? ?MOTOR:  ?normal bulk and tone, full strength in the BUE, BLE ? ?SENSORY:  ?normal and symmetric to light touch, temperature, vibration ? ?COORDINATION:  ?finger-nose-finger, fine finger movements normal ? ?REFLEXES:  ?deep tendon reflexes present and symmetric ? ?GAIT/STATION:  ?narrow based gait ? ? ? ?DIAGNOSTIC DATA (LABS, IMAGING, TESTING) ?- I reviewed patient records, labs, notes, testing and imaging myself where available. ? ?Lab Results  ?Component Value Date  ? WBC 3.3 (L) 10/10/2016  ? HGB 13.3 10/10/2016  ? HCT 39.6 10/10/2016  ? MCV 94.1 10/10/2016  ? PLT 109 (L) 10/10/2016  ? ?   ?Component Value Date/Time  ? NA 133 (L) 10/10/2016 0957  ? K 3.8 10/10/2016 0957  ? CL 100 (L) 10/10/2016 0957  ? CO2 23 10/10/2016 0957  ? GLUCOSE 125 (H) 10/10/2016 0957  ? BUN 15 10/10/2016 0957  ? CREATININE 0.87 10/10/2016 0957  ? CALCIUM 8.7 (L) 10/10/2016 0957  ? GFRNONAA >60 10/10/2016 0957  ?  GFRAA >60 10/10/2016 0957  ? ?No results found for: CHOL, HDL, LDLCALC, LDLDIRECT, TRIG, CHOLHDL ?No results found for: HGBA1C ?No results found for: VITAMINB12 ?Lab Results  ?Component Value Date  ? TSH 0.63 03/22/2012  ? ? ? ?08/17/19 MRI b

## 2021-09-08 DIAGNOSIS — L218 Other seborrheic dermatitis: Secondary | ICD-10-CM | POA: Diagnosis not present

## 2021-09-08 DIAGNOSIS — L821 Other seborrheic keratosis: Secondary | ICD-10-CM | POA: Diagnosis not present

## 2021-09-08 DIAGNOSIS — D1801 Hemangioma of skin and subcutaneous tissue: Secondary | ICD-10-CM | POA: Diagnosis not present

## 2021-09-08 DIAGNOSIS — Z85828 Personal history of other malignant neoplasm of skin: Secondary | ICD-10-CM | POA: Diagnosis not present

## 2021-11-22 ENCOUNTER — Telehealth: Payer: Self-pay | Admitting: Diagnostic Neuroimaging

## 2021-11-22 NOTE — Telephone Encounter (Signed)
Spoke with husband and advised he call PCP to have her evaluated for underlying medical causes to her current symptoms. Advised him Dr Leta Baptist is out of office till [place], and if PCP rules out medical causes he can schedule her for FU here.  He verbalized understanding, appreciation.

## 2021-11-22 NOTE — Telephone Encounter (Signed)
Pt's husband, Jennifer Mccall (on [place]) Cognitive is declining. Last 2 wks having anxiety, shaking, unable to dress, not eating, not able to communicate. Would like a call from the nurse to discuss a sooner appt or to be worked in.

## 2021-11-23 ENCOUNTER — Other Ambulatory Visit: Payer: Self-pay | Admitting: Registered Nurse

## 2021-11-23 ENCOUNTER — Ambulatory Visit (HOSPITAL_COMMUNITY)
Admission: RE | Admit: 2021-11-23 | Discharge: 2021-11-23 | Disposition: A | Discharge status: Home / Self Care | Payer: Medicare Other | Source: Ambulatory Visit | Attending: Registered Nurse | Admitting: Registered Nurse

## 2021-11-23 ENCOUNTER — Other Ambulatory Visit (HOSPITAL_COMMUNITY): Payer: Self-pay | Admitting: Registered Nurse

## 2021-11-23 DIAGNOSIS — R410 Disorientation, unspecified: Secondary | ICD-10-CM

## 2021-11-23 DIAGNOSIS — R2681 Unsteadiness on feet: Secondary | ICD-10-CM

## 2021-11-23 DIAGNOSIS — A6000 Herpesviral infection of urogenital system, unspecified: Secondary | ICD-10-CM | POA: Diagnosis not present

## 2021-11-23 DIAGNOSIS — E871 Hypo-osmolality and hyponatremia: Secondary | ICD-10-CM | POA: Diagnosis not present

## 2021-11-23 DIAGNOSIS — F321 Major depressive disorder, single episode, moderate: Secondary | ICD-10-CM | POA: Diagnosis not present

## 2021-11-23 DIAGNOSIS — N390 Urinary tract infection, site not specified: Secondary | ICD-10-CM | POA: Diagnosis not present

## 2021-11-23 DIAGNOSIS — R350 Frequency of micturition: Secondary | ICD-10-CM | POA: Diagnosis not present

## 2021-11-23 MED ORDER — GADOBUTROL 1 MMOL/ML IV SOLN
5.0000 mL | Freq: Once | INTRAVENOUS | Status: AC | PRN
  Administered 2021-11-23: 5 mL via INTRAVENOUS

## 2021-11-25 MED ORDER — CLONAZEPAM 0.5 MG PO TABS: 0.5000 mg | ORAL_TABLET | Freq: Two times a day (BID) | ORAL | 1 refills | Status: DC | PRN

## 2021-11-25 NOTE — Telephone Encounter (Signed)
Meds ordered this encounter  Medications   clonazePAM (KLONOPIN) 0.5 MG tablet    Sig: Take 1 tablet (0.5 mg total) by mouth 2 (two) times daily as needed for anxiety.    Dispense:  30 tablet    Refill:  1    Penni Bombard, MD 2/95/1884, 1:66 PM Certified in Neurology, Neurophysiology and Neuroimaging  [facility] [address] [place], [place] [address] [phone]

## 2021-11-25 NOTE — Telephone Encounter (Signed)
I called the pt's husband back and we discussed message he is agreeable to this plan and will let us know how the pt does on the med.  He will keep appt or 12/15/2021

## 2021-11-25 NOTE — Telephone Encounter (Signed)
Pt's husband stated, have seen PCP had test done and everything came back normal. Would like a call from the nurse to discuss a sooner appt or a work in appt or medication to help her anxiety.

## 2021-11-25 NOTE — Addendum Note (Signed)
Addended by: Andrey Spearman R on: 11/25/2021 03:42 PM   Modules accepted: Orders

## 2021-11-25 NOTE — Telephone Encounter (Signed)
Just an FYI we received the referral from patients PCP and I called and got her scheduled for next available on 12/15/21. Husband asked what he should be doing in the meantime (in regards to her extreme anxiety and shaking)-- I wasn't aware of this conversation before calling to schedule and I let him know our clinical team would give him a call back as soon as we have some answers for him. I also put her on cancellation list for any sooner openings.

## 2021-11-25 NOTE — Telephone Encounter (Signed)
Called husband who stated PCP did labs, UA MRI and no cause found for her decline. She now has a Caregiver 4 days a week and caregiver told husband she has noticed big decline in patient in past week or so. He stated she's been taking Zoloft 50 mg x 1 1/2 months, can't tell much difference in her. She is on memantine 10 mg twice a day. He stated she hasn't slept in past two nights, is agitated, anxious off and on all day. He is asking if she can be seen and/or something prescribed to help her. I advised will send to Dr Leta Baptist and call him later today. He  verbalized understanding, appreciation.

## 2021-11-29 ENCOUNTER — Encounter (HOSPITAL_COMMUNITY): Payer: Self-pay

## 2021-11-29 ENCOUNTER — Emergency Department (HOSPITAL_COMMUNITY): Payer: Medicare Other

## 2021-11-29 ENCOUNTER — Inpatient Hospital Stay (HOSPITAL_COMMUNITY)
Admission: EM | Admit: 2021-11-29 | Discharge: 2021-12-01 | DRG: 884 | Disposition: A | Discharge status: Hospice | Payer: Medicare Other | Attending: Internal Medicine | Admitting: Internal Medicine

## 2021-11-29 ENCOUNTER — Other Ambulatory Visit: Payer: Self-pay

## 2021-11-29 DIAGNOSIS — R404 Transient alteration of awareness: Secondary | ICD-10-CM | POA: Diagnosis not present

## 2021-11-29 DIAGNOSIS — R627 Adult failure to thrive: Secondary | ICD-10-CM

## 2021-11-29 DIAGNOSIS — I499 Cardiac arrhythmia, unspecified: Secondary | ICD-10-CM | POA: Diagnosis not present

## 2021-11-29 DIAGNOSIS — E876 Hypokalemia: Secondary | ICD-10-CM

## 2021-11-29 DIAGNOSIS — Z66 Do not resuscitate: Secondary | ICD-10-CM | POA: Diagnosis present

## 2021-11-29 DIAGNOSIS — Z885 Allergy status to narcotic agent status: Secondary | ICD-10-CM | POA: Diagnosis not present

## 2021-11-29 DIAGNOSIS — F0394 Unspecified dementia, unspecified severity, with anxiety: Secondary | ICD-10-CM | POA: Diagnosis not present

## 2021-11-29 DIAGNOSIS — R4020 Unspecified coma: Secondary | ICD-10-CM | POA: Diagnosis not present

## 2021-11-29 DIAGNOSIS — Z881 Allergy status to other antibiotic agents status: Secondary | ICD-10-CM

## 2021-11-29 DIAGNOSIS — E860 Dehydration: Secondary | ICD-10-CM | POA: Diagnosis not present

## 2021-11-29 DIAGNOSIS — Z91048 Other nonmedicinal substance allergy status: Secondary | ICD-10-CM

## 2021-11-29 DIAGNOSIS — Z79899 Other long term (current) drug therapy: Secondary | ICD-10-CM | POA: Diagnosis not present

## 2021-11-29 DIAGNOSIS — Z515 Encounter for palliative care: Secondary | ICD-10-CM

## 2021-11-29 DIAGNOSIS — G309 Alzheimer's disease, unspecified: Secondary | ICD-10-CM | POA: Diagnosis not present

## 2021-11-29 DIAGNOSIS — R531 Weakness: Secondary | ICD-10-CM | POA: Diagnosis not present

## 2021-11-29 DIAGNOSIS — Z7401 Bed confinement status: Secondary | ICD-10-CM | POA: Diagnosis not present

## 2021-11-29 DIAGNOSIS — F0390 Unspecified dementia without behavioral disturbance: Secondary | ICD-10-CM

## 2021-11-29 DIAGNOSIS — R4182 Altered mental status, unspecified: Secondary | ICD-10-CM | POA: Diagnosis not present

## 2021-11-29 DIAGNOSIS — Z888 Allergy status to other drugs, medicaments and biological substances status: Secondary | ICD-10-CM

## 2021-11-29 DIAGNOSIS — D696 Thrombocytopenia, unspecified: Secondary | ICD-10-CM | POA: Diagnosis present

## 2021-11-29 DIAGNOSIS — E785 Hyperlipidemia, unspecified: Secondary | ICD-10-CM | POA: Diagnosis present

## 2021-11-29 DIAGNOSIS — R64 Cachexia: Secondary | ICD-10-CM | POA: Diagnosis present

## 2021-11-29 DIAGNOSIS — R509 Fever, unspecified: Secondary | ICD-10-CM | POA: Diagnosis not present

## 2021-11-29 DIAGNOSIS — F0280 Dementia in other diseases classified elsewhere without behavioral disturbance: Secondary | ICD-10-CM | POA: Diagnosis not present

## 2021-11-29 LAB — URINALYSIS, ROUTINE W REFLEX MICROSCOPIC
Bilirubin Urine: NEGATIVE
Glucose, UA: NEGATIVE mg/dL
Ketones, ur: 20 mg/dL — AB
Leukocytes,Ua: NEGATIVE
Nitrite: NEGATIVE
Protein, ur: NEGATIVE mg/dL
Specific Gravity, Urine: 1.016 (ref 1.005–1.030)
pH: 6 (ref 5.0–8.0)

## 2021-11-29 LAB — CBC WITH DIFFERENTIAL/PLATELET
Abs Immature Granulocytes: 0.02 10*3/uL (ref 0.00–0.07)
Basophils Absolute: 0 10*3/uL (ref 0.0–0.1)
Basophils Relative: 0 %
Eosinophils Absolute: 0 10*3/uL (ref 0.0–0.5)
Eosinophils Relative: 0 %
HCT: 44.2 % (ref 36.0–46.0)
Hemoglobin: 15.4 g/dL — ABNORMAL HIGH (ref 12.0–15.0)
Immature Granulocytes: 0 %
Lymphocytes Relative: 11 %
Lymphs Abs: 0.7 10*3/uL (ref 0.7–4.0)
MCH: 32 pg (ref 26.0–34.0)
MCHC: 34.8 g/dL (ref 30.0–36.0)
MCV: 91.9 fL (ref 80.0–100.0)
Monocytes Absolute: 0.4 10*3/uL (ref 0.1–1.0)
Monocytes Relative: 6 %
Neutro Abs: 5.7 10*3/uL (ref 1.7–7.7)
Neutrophils Relative %: 83 %
Platelets: 126 10*3/uL — ABNORMAL LOW (ref 150–400)
RBC: 4.81 MIL/uL (ref 3.87–5.11)
RDW: 12.5 % (ref 11.5–15.5)
WBC: 6.9 10*3/uL (ref 4.0–10.5)
nRBC: 0 % (ref 0.0–0.2)

## 2021-11-29 LAB — COMPREHENSIVE METABOLIC PANEL
ALT: 21 U/L (ref 0–44)
AST: 31 U/L (ref 15–41)
Albumin: 4.2 g/dL (ref 3.5–5.0)
Alkaline Phosphatase: 38 U/L (ref 38–126)
Anion gap: 11 (ref 5–15)
BUN: 13 mg/dL (ref 8–23)
CO2: 23 mmol/L (ref 22–32)
Calcium: 8.8 mg/dL — ABNORMAL LOW (ref 8.9–10.3)
Chloride: 104 mmol/L (ref 98–111)
Creatinine, Ser: 0.52 mg/dL (ref 0.44–1.00)
GFR, Estimated: >60 mL/min (ref >60)
Glucose, Bld: 95 mg/dL (ref 70–99)
Potassium: 2.9 mmol/L — ABNORMAL LOW (ref 3.5–5.1)
Sodium: 138 mmol/L (ref 135–145)
Total Bilirubin: 2.1 mg/dL — ABNORMAL HIGH (ref 0.3–1.2)
Total Protein: 6.9 g/dL (ref 6.5–8.1)

## 2021-11-29 LAB — LACTIC ACID, PLASMA: Lactic Acid, Venous: 1 mmol/L (ref 0.5–1.9)

## 2021-11-29 LAB — ETHANOL: Alcohol, Ethyl (B): <10 mg/dL (ref <10)

## 2021-11-29 MED ORDER — POTASSIUM CHLORIDE IN NACL 20-0.9 MEQ/L-% IV SOLN
Freq: Once | INTRAVENOUS | Status: AC
  Filled 2021-11-29: qty 1000

## 2021-11-29 MED ORDER — ONDANSETRON HCL 4 MG PO TABS
4.0000 mg | ORAL_TABLET | Freq: Four times a day (QID) | ORAL | Status: DC | PRN
Start: 2021-11-29 — End: 2021-11-30

## 2021-11-29 MED ORDER — ENOXAPARIN SODIUM 40 MG/0.4ML IJ SOSY: 40.0000 mg | PREFILLED_SYRINGE | INTRAMUSCULAR | Status: DC

## 2021-11-29 MED ORDER — HALOPERIDOL LACTATE 5 MG/ML IJ SOLN
1.0000 mg | INTRAMUSCULAR | Status: DC | PRN
  Administered 2021-11-30: 1 mg via INTRAVENOUS
  Filled 2021-11-29: qty 1

## 2021-11-29 MED ORDER — LIP MEDEX EX OINT
TOPICAL_OINTMENT | Freq: Once | CUTANEOUS | Status: AC
  Administered 2021-11-29: 1 via TOPICAL
  Filled 2021-11-29: qty 7

## 2021-11-29 MED ORDER — ACETAMINOPHEN 325 MG PO TABS
650.0000 mg | ORAL_TABLET | Freq: Four times a day (QID) | ORAL | Status: DC | PRN
Start: 2021-11-29 — End: 2021-11-30

## 2021-11-29 MED ORDER — LORAZEPAM 2 MG/ML IJ SOLN
0.5000 mg | INTRAMUSCULAR | Status: DC | PRN
  Administered 2021-11-30: 0.5 mg via INTRAVENOUS
  Filled 2021-11-29: qty 1

## 2021-11-29 MED ORDER — SODIUM CHLORIDE 0.9 % IV SOLN: INTRAVENOUS | Status: DC

## 2021-11-29 MED ORDER — SODIUM CHLORIDE 0.9 % IV BOLUS
1000.0000 mL | Freq: Once | INTRAVENOUS | Status: AC
  Administered 2021-11-29: 1000 mL via INTRAVENOUS

## 2021-11-29 MED ORDER — POTASSIUM CHLORIDE IN NACL 40-0.9 MEQ/L-% IV SOLN
INTRAVENOUS | Status: DC
Start: 2021-11-29 — End: 2021-12-01
  Filled 2021-11-29 (×4): qty 1000

## 2021-11-29 MED ORDER — ONDANSETRON HCL 4 MG/2ML IJ SOLN: 4.0000 mg | Freq: Four times a day (QID) | INTRAMUSCULAR | Status: DC | PRN

## 2021-11-29 MED ORDER — ACETAMINOPHEN 650 MG RE SUPP: 650.0000 mg | Freq: Four times a day (QID) | RECTAL | Status: DC | PRN

## 2021-11-29 NOTE — ED Triage Notes (Signed)
EMS reports from home, AMS, hx of Dementia. Husband states change of baseline x 2 days.  BP 133/78 HR 86 RR 22 Sp02 96 RA CBG 94  20 LAC 184m NS

## 2021-11-29 NOTE — Progress Notes (Signed)
   11/29/21 2253  Assess: MEWS Score  Temp 98.7 F (37.1 C)  BP (!) 96/57  MAP (mmHg) 70  Pulse Rate 71  Resp 18  SpO2 96 %  O2 Device Room Air  Assess: MEWS Score  MEWS Temp 0  MEWS Systolic 1  MEWS Pulse 0  MEWS RR 0  MEWS LOC 2  MEWS Score 3  MEWS Score Color Yellow  Assess: if the MEWS score is Yellow or Red  Were vital signs taken at a resting state? Yes  Focused Assessment No change from prior assessment  Does the patient meet 2 or more of the SIRS criteria? No  MEWS guidelines implemented *See Row Information* Yes  Treat  MEWS Interventions Escalated (See documentation below)  Pain Scale PAINAD  Pain Score 0  Take Vital Signs  Increase Vital Sign Frequency  Yellow: Q 2hr X 2 then Q 4hr X 2, if remains yellow, continue Q 4hrs  Escalate  MEWS: Escalate Yellow: discuss with charge nurse/RN and consider discussing with provider and RRT  Notify: Charge Nurse/RN  Name of Charge Nurse/RN Notified [place], RN  Date Charge Nurse/RN Notified 11/29/21  Time Charge Nurse/RN Notified 2307  Notify: Provider  Provider Name/Title Clarene Essex, NP  Date Provider Notified 11/29/21  Time Provider Notified 2250  Method of Notification Page  Notification Reason Change in status  Provider response No new orders  Date of Provider Response 11/29/21  Time of Provider Response 2252  Assess: SIRS CRITERIA  SIRS Temperature  0  SIRS Pulse 0  SIRS Respirations  0  SIRS WBC 1  SIRS Score Sum  1   Patient is a new admission from the ED. Patient only responds to pain. Has her eyes closed. Has a yellow MEWs due to AMS. Does not appear to be in any distress.

## 2021-11-29 NOTE — H&P (Signed)
History and Physical    Jennifer Mccall [facility] ZOX:[id number] DOB: 1950-10-04 DOA: 11/29/2021  PCP: Haywood Pao, MD   Patient coming from: Home  I have personally briefly reviewed patient's old medical records in [place]  Chief Complaint: Altered mental status  HPI: Jennifer Mccall is a 71 y.o. female with medical history significant of dementia, hyperlipidemia presented with altered mental status.  Patient cannot provide any history because of her mental status.  I have reviewed patient's medical records including ED providers documentation and have spoken to ED provider myself.  No reported fall, trauma, fever.  Patient is apparently no longer eating, feeding herself, drinking.  She has been seen and evaluated by her PCP and neurologist over the past few weeks and has had unremarkable MRI of brain and other lab results.  ED Course: Potassium was 2.9.  CT of head was negative for acute intracranial abnormality.  Chest x-ray was negative for acute infiltrates.  UA unremarkable. Hospitalist service was called to evaluate the patient.  Review of Systems: Could not be obtained because of mental status  Past Medical History:  Diagnosis Date   Anorexia    H/O   Anxiety    Dry eye    Dyslipidemia    Hyperlipidemia    Hypotension, unspecified    Lightheadedness    Memory impairment    Osteopenia    Urinary retention with incomplete bladder emptying    Weight loss, unintentional     Past Surgical History:  Procedure Laterality Date   ABDOMINAL HYSTERECTOMY     APPENDECTOMY     CATARACT EXTRACTION Bilateral      reports that she has never smoked. She has never used smokeless tobacco. She reports current alcohol use. She reports that she does not use drugs.  Allergies  Allergen Reactions   Codeine Anaphylaxis   Erythromycin Hives   Codeine    Demerol [Meperidine Hcl] Nausea And Vomiting   Demerol [Meperidine]    Erythorbic Acid    Erythromycin Base Hives    Ether Nausea And Vomiting   Tape     Other reaction(s): Other (See Comments) Pt has thin skin, prefers paper tape    Family History  Problem Relation Age of Onset   Glaucoma Mother    Dementia Mother    Alcoholism Father    Lung cancer Father    Colon cancer Maternal Grandmother    Heart attack Paternal Grandfather    Prostate cancer Brother    Cancer - Lung Father     Prior to Admission medications   Medication Sig Start Date End Date Taking? Authorizing Provider  acyclovir (ZOVIRAX) 200 MG capsule TAKE ONE CAPSULE BY MOUTH TWICE A DAY 06/17/13   Elveria Rising, MD  clonazePAM (KLONOPIN) 0.5 MG tablet Take 1 tablet (0.5 mg total) by mouth 2 (two) times daily as needed for anxiety. 11/25/21   Penumalli, Earlean Polka, MD  cycloSPORINE (RESTASIS) 0.05 % ophthalmic emulsion Place 1 drop into both eyes 2 times daily. 08/20/19   [provider]  memantine (NAMENDA) 10 MG tablet Take 1 tablet (10 mg total) by mouth 2 (two) times daily. 09/07/21   Penumalli, Earlean Polka, MD  Multiple Vitamin (QUINTABS) TABS Take 1 tablet by mouth daily.    [provider]  sertraline (ZOLOFT) 25 MG tablet Take 1 tablet (25 mg total) by mouth daily. 09/07/21   Penni Bombard, MD    Physical Exam: Vitals:   11/29/21 1200 11/29/21 1357 11/29/21 1430  11/29/21 1500  BP: (!) 161/103 (!) 172/81 134/74 (!) 149/106  Pulse: 96 89 88   Resp: '18 18 18 '$ (!) 21  Temp:      TempSrc:      SpO2: 98% 100% 98%     Constitutional: NAD, calm, comfortable.  Cachectic looking.  Looks chronically ill and deconditioned.  Elderly female lying in bed. Vitals:   11/29/21 1200 11/29/21 1357 11/29/21 1430 11/29/21 1500  BP: (!) 161/103 (!) 172/81 134/74 (!) 149/106  Pulse: 96 89 88   Resp: '18 18 18 '$ (!) 21  Temp:      TempSrc:      SpO2: 98% 100% 98%    Eyes: PERRL, lids and conjunctivae normal ENMT: Mucous membranes are dry.  Posterior pharynx clear of any exudate or lesions. Neck: normal, supple, no masses,  no thyromegaly Respiratory: bilateral decreased breath sounds at bases, no wheezing, no crackles. Normal respiratory effort. No accessory muscle use.  Cardiovascular: S1 S2 positive, rate controlled. No extremity edema. 2+ pedal pulses.  Abdomen: no tenderness, no masses palpated. No hepatosplenomegaly. Bowel sounds positive.  Musculoskeletal: no clubbing / cyanosis. No joint deformity upper and lower extremities.  Skin: no rashes, lesions, ulcers. No induration Neurologic: CN 2-12 grossly intact. Moving extremities. No focal neurologic deficits.  Does not follow commands. Psychiatric: Cannot be assessed because of mental status  Labs on Admission: I have personally reviewed following labs and imaging studies  CBC: Recent Labs  Lab 11/29/21 1136  WBC 6.9  NEUTROABS 5.7  HGB 15.4*  HCT 44.2  MCV 91.9  PLT 502*   Basic Metabolic Panel: Recent Labs  Lab 11/29/21 1136  NA 138  K 2.9*  CL 104  CO2 23  GLUCOSE 95  BUN 13  CREATININE 0.52  CALCIUM 8.8*   GFR: CrCl cannot be calculated (Unknown ideal weight.). Liver Function Tests: Recent Labs  Lab 11/29/21 1136  AST 31  ALT 21  ALKPHOS 38  BILITOT 2.1*  PROT 6.9  ALBUMIN 4.2   No results for input(s): "LIPASE", "AMYLASE" in the last 168 hours. No results for input(s): "AMMONIA" in the last 168 hours. Coagulation Profile: No results for input(s): "INR", "PROTIME" in the last 168 hours. Cardiac Enzymes: No results for input(s): "CKTOTAL", "CKMB", "CKMBINDEX", "TROPONINI" in the last 168 hours. BNP (last 3 results) No results for input(s): "PROBNP" in the last 8760 hours. HbA1C: No results for input(s): "HGBA1C" in the last 72 hours. CBG: No results for input(s): "GLUCAP" in the last 168 hours. Lipid Profile: No results for input(s): "CHOL", "HDL", "LDLCALC", "TRIG", "CHOLHDL", "LDLDIRECT" in the last 72 hours. Thyroid Function Tests: No results for input(s): "TSH", "T4TOTAL", "FREET4", "T3FREE", "THYROIDAB" in  the last 72 hours. Anemia Panel: No results for input(s): "VITAMINB12", "FOLATE", "FERRITIN", "TIBC", "IRON", "RETICCTPCT" in the last 72 hours. Urine analysis:    Component Value Date/Time   COLORURINE YELLOW 11/29/2021 1057   APPEARANCEUR CLEAR 11/29/2021 1057   LABSPEC 1.016 11/29/2021 1057   PHURINE 6.0 11/29/2021 1057   GLUCOSEU NEGATIVE 11/29/2021 1057   HGBUR SMALL (A) 11/29/2021 1057   BILIRUBINUR NEGATIVE 11/29/2021 1057   KETONESUR 20 (A) 11/29/2021 1057   PROTEINUR NEGATIVE 11/29/2021 1057   NITRITE NEGATIVE 11/29/2021 1057   LEUKOCYTESUR NEGATIVE 11/29/2021 1057    Radiological Exams on Admission: DG Chest Port 1 View  Result Date: 11/29/2021 CLINICAL DATA:  Altered level of consciousness. EXAM: PORTABLE CHEST 1 VIEW COMPARISON:  None Available. FINDINGS: Normal cardiomediastinal contours. No pleural effusion or edema  identified. No airspace opacities. The visualized osseous structures are unremarkable. IMPRESSION: No acute cardiopulmonary abnormalities. Electronically Signed   By: Kerby Moors M.D.   On: 11/29/2021 11:35   CT HEAD WO CONTRAST  Result Date: 11/29/2021 CLINICAL DATA:  71 year old female with altered mental status over the pasti 2 days. EXAM: CT HEAD WITHOUT CONTRAST TECHNIQUE: Contiguous axial images were obtained from the base of the skull through the vertex without intravenous contrast. RADIATION DOSE REDUCTION: This exam was performed according to the departmental dose-optimization program which includes automated exposure control, adjustment of the mA and/or kV according to patient size and/or use of iterative reconstruction technique. COMPARISON:  Brain MRI 11/23/2021. FINDINGS: Brain: Stable cerebral volume. No midline shift, ventriculomegaly, mass effect, evidence of mass lesion, intracranial hemorrhage or evidence of cortically based acute infarction. Gray-white matter differentiation appears preserved, and appears stable to the recent MRI with mild for  age nonspecific white matter changes. No cortical encephalomalacia identified. Vascular: Mild Calcified atherosclerosis at the skull base. No suspicious intracranial vascular hyperdensity. Skull: No acute osseous abnormality identified. Sinuses/Orbits: Bubbly opacity in the left sphenoid sinus not significantly changed from earlier this month. Other Visualized paranasal sinuses and mastoids are stable and well aerated. Other: No acute orbit or scalp soft tissue finding. IMPRESSION: 1. Negative for age noncontrast CT appearance of the brain. 2. Mild paranasal sinus inflammation, stable from earlier this month. Electronically Signed   By: Genevie Ann M.D.   On: 11/29/2021 11:26     Assessment/Plan  Failure to thrive Possible worsening dementia -Presented with failure to thrive with very poor oral intake over the last few weeks.  Work-up as an outpatient including MRI of brain has been extensively unremarkable. -CT of the head, chest x-ray and UA today unremarkable -Family requesting palliative care consultation.  Will consult palliative care.  Family interested in hospice: Do not think that they can provide appropriate care at home; ?  May be a candidate for residential hospice -Continue supportive care including gentle hydration. -Fall precautions.  Doubt that patient will be a candidate for SLP evaluation, PT evaluation. -Will not order repeat labs in AM.  Dehydration -Continue IV fluids  Hypokalemia -Plan as above  Thrombocytopenia -Questionable cause.  No signs of bleeding.    DVT prophylaxis: None for comfort  code Status: DNR Family Communication: Husband and daughters at bedside Disposition Plan: Might benefit from residential hospice Consults called: Palliative care Admission status: Inpatient/MedSurg  Severity of Illness: The appropriate patient status for this patient is INPATIENT. Inpatient status is judged to be reasonable and necessary in order to provide the required intensity  of service to ensure the patient's safety. The patient's presenting symptoms, physical exam findings, and initial radiographic and laboratory data in the context of their chronic comorbidities is felt to place them at high risk for further clinical deterioration. Furthermore, it is not anticipated that the patient will be medically stable for discharge from the hospital within 2 midnights of admission.   * I certify that at the point of admission it is my clinical judgment that the patient will require inpatient hospital care spanning beyond 2 midnights from the point of admission due to high intensity of service, high risk for further deterioration and high frequency of surveillance required.Aline August MD [facility]  11/29/2021, 3:53 PM

## 2021-11-29 NOTE — ED Provider Notes (Signed)
[facility] DEPT Provider Note   CSN: [id number] Arrival date & time: 11/29/21  1031     History  Chief Complaint  Patient presents with   Altered Mental Status    Jennifer Mccall is a 71 y.o. female.  HPI Patient with a history of dementia presents from home via EMS.  Patient cannot provide any details, level 5 caveat.  Per EMS the patient's husband reports that for the past 2 days she has been more withdrawn than usual.  No reported fall, trauma, fever. The patient cannot provide any details at all about her presentation.  EMS reports no hemodynamic instability in route.  She did have 1 nonsustained episode of tachycardia, however.    Home Medications Prior to Admission medications   Medication Sig Start Date End Date Taking? Authorizing Provider  acyclovir (ZOVIRAX) 200 MG capsule TAKE ONE CAPSULE BY MOUTH TWICE A DAY 06/17/13   Elveria Rising, MD  clonazePAM (KLONOPIN) 0.5 MG tablet Take 1 tablet (0.5 mg total) by mouth 2 (two) times daily as needed for anxiety. 11/25/21   Penumalli, Earlean Polka, MD  cycloSPORINE (RESTASIS) 0.05 % ophthalmic emulsion Place 1 drop into both eyes 2 times daily. 08/20/19   [provider]  memantine (NAMENDA) 10 MG tablet Take 1 tablet (10 mg total) by mouth 2 (two) times daily. 09/07/21   Penumalli, Earlean Polka, MD  Multiple Vitamin (QUINTABS) TABS Take 1 tablet by mouth daily.    [provider]  sertraline (ZOLOFT) 25 MG tablet Take 1 tablet (25 mg total) by mouth daily. 09/07/21   Penumalli, Earlean Polka, MD      Allergies    Codeine, Erythromycin, Codeine, Demerol [meperidine hcl], Demerol [meperidine], Erythorbic acid, Erythromycin base, Ether, and Tape    Review of Systems   Review of Systems  Unable to perform ROS: Dementia    Physical Exam Updated Vital Signs BP 134/74   Pulse 88   Temp 98.4 F (36.9 C) (Oral)   Resp 18   SpO2 98%  Physical Exam Vitals and nursing note reviewed.   Constitutional:      Appearance: She is well-developed.     Comments: Chronically ill-appearing cachectic elderly female  HENT:     Head: Normocephalic and atraumatic.  Eyes:     Conjunctiva/sclera: Conjunctivae normal.  Cardiovascular:     Rate and Rhythm: Normal rate and regular rhythm.  Pulmonary:     Effort: Pulmonary effort is normal. No respiratory distress.     Breath sounds: Normal breath sounds. No stridor.  Abdominal:     General: There is no distension.  Skin:    General: Skin is warm and dry.  Neurological:     Mental Status: She is alert.     Cranial Nerves: No cranial nerve deficit.     Motor: Atrophy present.     Comments: Does not follow commands, seems to move all extremities spontaneously.  Psychiatric:        Cognition and Memory: Cognition is impaired. Memory is impaired.     ED Results / Procedures / Treatments   Labs (all labs ordered are listed, but only abnormal results are displayed) Labs Reviewed  URINALYSIS, ROUTINE W REFLEX MICROSCOPIC - Abnormal; Notable for the following components:      Result Value   Hgb urine dipstick SMALL (*)    Ketones, ur 20 (*)    Bacteria, UA RARE (*)    All other components within normal limits  CBC WITH DIFFERENTIAL/PLATELET -  Abnormal; Notable for the following components:   Hemoglobin 15.4 (*)    Platelets 126 (*)    All other components within normal limits  COMPREHENSIVE METABOLIC PANEL - Abnormal; Notable for the following components:   Potassium 2.9 (*)    Calcium 8.8 (*)    Total Bilirubin 2.1 (*)    All other components within normal limits  ETHANOL  LACTIC ACID, PLASMA    EKG EKG Interpretation  Date/Time:  Monday November 29 2021 10:58:39 EDT Ventricular Rate:  99 PR Interval:  116 QRS Duration: 85 QT Interval:  360 QTC Calculation: 462 R Axis:   14 Text Interpretation: Sinus rhythm Probable left atrial enlargement RSR' in V1 or V2, probably normal variant Left ventricular hypertrophy  Nonspecific T abnrm, anterolateral leads Abnormal ECG Confirmed by Carmin Muskrat [phone]) on 11/29/2021 12:41:25 PM  Radiology DG Chest Port 1 View  Result Date: 11/29/2021 CLINICAL DATA:  Altered level of consciousness. EXAM: PORTABLE CHEST 1 VIEW COMPARISON:  None Available. FINDINGS: Normal cardiomediastinal contours. No pleural effusion or edema identified. No airspace opacities. The visualized osseous structures are unremarkable. IMPRESSION: No acute cardiopulmonary abnormalities. Electronically Signed   By: Kerby Moors M.D.   On: 11/29/2021 11:35   CT HEAD WO CONTRAST  Result Date: 11/29/2021 CLINICAL DATA:  71 year old female with altered mental status over the pasti 2 days. EXAM: CT HEAD WITHOUT CONTRAST TECHNIQUE: Contiguous axial images were obtained from the base of the skull through the vertex without intravenous contrast. RADIATION DOSE REDUCTION: This exam was performed according to the departmental dose-optimization program which includes automated exposure control, adjustment of the mA and/or kV according to patient size and/or use of iterative reconstruction technique. COMPARISON:  Brain MRI 11/23/2021. FINDINGS: Brain: Stable cerebral volume. No midline shift, ventriculomegaly, mass effect, evidence of mass lesion, intracranial hemorrhage or evidence of cortically based acute infarction. Gray-white matter differentiation appears preserved, and appears stable to the recent MRI with mild for age nonspecific white matter changes. No cortical encephalomalacia identified. Vascular: Mild Calcified atherosclerosis at the skull base. No suspicious intracranial vascular hyperdensity. Skull: No acute osseous abnormality identified. Sinuses/Orbits: Bubbly opacity in the left sphenoid sinus not significantly changed from earlier this month. Other Visualized paranasal sinuses and mastoids are stable and well aerated. Other: No acute orbit or scalp soft tissue finding. IMPRESSION: 1. Negative for age  noncontrast CT appearance of the brain. 2. Mild paranasal sinus inflammation, stable from earlier this month. Electronically Signed   By: Genevie Ann M.D.   On: 11/29/2021 11:26    Procedures Procedures    Medications Ordered in ED Medications  sodium chloride 0.9 % bolus 1,000 mL (0 mLs Intravenous Stopped 11/29/21 1400)    And  0.9 %  sodium chloride infusion (has no administration in time range)  0.9 % NaCl with KCl 20 mEq/ L  infusion ( Intravenous New Bag/Given 11/29/21 1426)    ED Course/ Medical Decision Making/ A&P This patient with a Hx of dementia presents to the ED for concern of altered mental status, this involves an extensive number of treatment options, and is a complaint that carries with it a high risk of complications and morbidity.    The differential diagnosis includes progression of disease, versus infection versus intracranial abnormality   Social Determinants of Health:  Dementia, none functional status at baseline  Additional history obtained:  Additional history and/or information obtained from EMS, notable for details included above   After the initial evaluation, orders, including: Head CT, x-ray, labs,  fluids were initiated.   Patient placed on Cardiac and Pulse-Oximetry Monitors. The patient was maintained on a cardiac monitor.  The cardiac monitored showed an rhythm of 90 sinus normal The patient was also maintained on pulse oximetry. The readings were typically 100% room air normal   On repeat evaluation of the patient stayed the same  Lab Tests:  I personally interpreted labs.  The pertinent results include: Hypokalemia, ketone urea  Imaging Studies ordered:  I independently visualized and interpreted imaging which showed unremarkable head CT, x-ray chest I agree with the radiologist interpretation  Consultations Obtained:  I requested consultation with the palliative care,  and discussed lab and imaging findings as well as pertinent plan -  they recommend: Admission, with your consultation   3:17 PM Patient accompanied by husband and 2 daughters.  We had a lengthy conversation about the patient's history, presentation.  Daughter is unknown to the patient about 3 weeks, and the husband notes that over the past 2+ weeks patient has had notable decline.  She is no longer seeking food, feeding herself, drinking.  She has been seen and evaluated by her neurologist and her primary care physician over the past 2 weeks, reportedly with unremarkable MRI, and lab results.  Husband requests palliative consult.  Dispostion / Final MDM:  After consideration of the diagnostic results and the patient's response to treatment, this adult female with dementia presents with weakness, withdrawn status beyond baseline, reportedly change over the past few weeks.  Labs in the ED setting notable for hypokalemia, dehydration with ketonuria.  No early evidence for concurrent infection, with unremarkable chest x-ray, urine analysis, no evidence of bacteremia, sepsis with no tachycardia, hypotension, fever.  Some suspicion for progression of disease, likely exacerbated by her electrolyte abnormalities and dehydration.  Patient received IV potassium that she has not taking oral supplements, palliative was consulted and they will see the patient in the hospital.  Final Clinical Impression(s) / ED Diagnoses Final diagnoses:  Altered mental status, unspecified altered mental status type  Dehydration  Hypokalemia     Carmin Muskrat, MD 11/29/21 [phone]

## 2021-11-30 DIAGNOSIS — Z66 Do not resuscitate: Secondary | ICD-10-CM

## 2021-11-30 DIAGNOSIS — Z515 Encounter for palliative care: Secondary | ICD-10-CM

## 2021-11-30 DIAGNOSIS — F0390 Unspecified dementia without behavioral disturbance: Secondary | ICD-10-CM | POA: Diagnosis not present

## 2021-11-30 DIAGNOSIS — R627 Adult failure to thrive: Secondary | ICD-10-CM | POA: Diagnosis not present

## 2021-11-30 DIAGNOSIS — R4182 Altered mental status, unspecified: Secondary | ICD-10-CM

## 2021-11-30 DIAGNOSIS — E876 Hypokalemia: Secondary | ICD-10-CM | POA: Diagnosis not present

## 2021-11-30 DIAGNOSIS — E860 Dehydration: Secondary | ICD-10-CM | POA: Diagnosis not present

## 2021-11-30 MED ORDER — GLYCOPYRROLATE 0.2 MG/ML IJ SOLN: 0.2000 mg | INTRAMUSCULAR | Status: DC | PRN

## 2021-11-30 MED ORDER — ACETAMINOPHEN 650 MG RE SUPP
650.0000 mg | Freq: Four times a day (QID) | RECTAL | Status: DC | PRN
Start: 2021-11-30 — End: 2021-12-01
  Administered 2021-11-30: 650 mg via RECTAL
  Filled 2021-11-30: qty 1

## 2021-11-30 MED ORDER — MORPHINE SULFATE (PF) 2 MG/ML IV SOLN
1.0000 mg | INTRAVENOUS | Status: DC | PRN
  Administered 2021-11-30 – 2021-12-01 (×3): 2 mg via INTRAVENOUS
  Filled 2021-11-30 (×3): qty 1

## 2021-11-30 MED ORDER — BIOTENE DRY MOUTH MT LIQD: 15.0000 mL | OROMUCOSAL | Status: DC | PRN

## 2021-11-30 MED ORDER — POLYVINYL ALCOHOL 1.4 % OP SOLN: 1.0000 [drp] | Freq: Four times a day (QID) | OPHTHALMIC | Status: DC | PRN

## 2021-11-30 NOTE — Progress Notes (Signed)
Nutrition Brief Note  Patient screened for MST. Chart reviewed. Pt now transitioning to comfort care with d/c plan for [facility] once a bed is available.   Patient is NPO. No further nutrition interventions planned at this time. Please re-consult as needed.      Jarome Matin, MS, RD, LDN, [place] Registered Dietitian II Inpatient Clinical Nutrition RD pager # and on-call/weekend pager # available in [facility]

## 2021-11-30 NOTE — Progress Notes (Addendum)
[facility]) Hospital Liaison Note:  Received referral from [facility] for [facility].   Chart reviewed and spoke with family at bedside to explain service and answer questions. Family has confirmed interest in transfer to [facility] when bed available if patient is medically stable. LM to make TOC aware.   ACC will update family/TOC when bed available at [facility].  Please reach out for any hospice related questions as needed,  Gar Ponto, RN [place] (on [place]) [phone]  UPDATE AT [place]. TOC/[facility] MD AWARE.   FAMILY WILL DO DOCUSIGN WITH ACC SW PRIOR TO TRANSFER - AND AN [facility] TEAM ONCE COMPLETED AND [facility] IS READY TO RECEIVE PATIENT.

## 2021-11-30 NOTE — Progress Notes (Signed)
PROGRESS NOTE    Jennifer Mccall [facility]  EVO:[id number] DOB: [birth year] DOA: 11/29/2021 PCP: Haywood Pao, MD   Brief Narrative:   71 y.o. female with medical history significant of dementia, hyperlipidemia presented with altered mental status, failure to thrive with extremely poor oral intake for the last few weeks with recent outpatient unremarkable MRI of brain and other lab results.  On presentation, potassium was 2.9.  CT of head was negative for acute intracranial abnormality.  Chest x-ray was negative for acute infiltrates.  UA unremarkable.  She was started on IV fluids.  Assessment & Plan:   Failure to thrive Possible worsening dementia -Presented with failure to thrive with very poor oral intake over the last few weeks.  Work-up as an outpatient including MRI of brain has been extensively unremarkable. -CT of the head, chest x-ray and UA today unremarkable -Awaiting palliative care consultation.  Family leaning towards residential hospice.  TOC consulted. -Continue supportive care including gentle hydration. -Fall precautions.  Doubt that patient will be a candidate for SLP evaluation, PT evaluation. -Will not order repeat labs in AM. -Use Ativan/Haldol/morphine as needed   Dehydration -Continue gentle hydration.   Hypokalemia -Plan as above   Thrombocytopenia -Questionable cause.  No signs of bleeding.     DVT prophylaxis: None for comfort Code Status: DNR Family Communication: Husband and daughter at bedside Disposition Plan: Status is: Inpatient Remains inpatient appropriate because: Of severity of illness.  Might need residential hospice  Consultants: Palliative care  Procedures: None  Antimicrobials: None   Subjective: Patient seen and examined at bedside.  In no distress.  Almost unresponsive.  No fever, vomiting or agitation reported.  Husband and daughter at bedside  Objective: Vitals:   11/29/21 2253 11/30/21 0104 11/30/21 0314 11/30/21 0645   BP: (!) 96/57 (!) 158/95 (!) 99/59 (!) 142/83  Pulse: 71 97 74 89  Resp: '18 16 18 16  '$ Temp: 98.7 F (37.1 C) 99.2 F (37.3 C) 99.2 F (37.3 C) 98.2 F (36.8 C)  TempSrc: Oral Oral Oral Oral  SpO2: 96% 91% 94% 96%    Intake/Output Summary (Last 24 hours) at 11/30/2021 2993 Last data filed at 11/30/2021 7169 Gross per 24 hour  Intake 366.48 ml  Output 650 ml  Net -283.52 ml   There were no vitals filed for this visit.  Examination:  General exam: Appears calm and comfortable.  Currently on room air. Respiratory system: Bilateral decreased breath sounds at bases Cardiovascular system: S1 & S2 heard, Rate controlled Gastrointestinal system: Abdomen is nondistended, soft and nontender. Normal bowel sounds heard. Extremities: No cyanosis, clubbing, edema  Central nervous system: Almost unresponsive, intermittently moving her upper extremities; no focal neurological deficits. Skin: No rashes, lesions or ulcers Psychiatry: Could not be assessed because of mental status.  Currently not agitated.    Data Reviewed: I have personally reviewed following labs and imaging studies  CBC: Recent Labs  Lab 11/29/21 1136  WBC 6.9  NEUTROABS 5.7  HGB 15.4*  HCT 44.2  MCV 91.9  PLT 678*   Basic Metabolic Panel: Recent Labs  Lab 11/29/21 1136  NA 138  K 2.9*  CL 104  CO2 23  GLUCOSE 95  BUN 13  CREATININE 0.52  CALCIUM 8.8*   GFR: CrCl cannot be calculated (Unknown ideal weight.). Liver Function Tests: Recent Labs  Lab 11/29/21 1136  AST 31  ALT 21  ALKPHOS 38  BILITOT 2.1*  PROT 6.9  ALBUMIN 4.2   No results for input(s): "LIPASE", "  AMYLASE" in the last 168 hours. No results for input(s): "AMMONIA" in the last 168 hours. Coagulation Profile: No results for input(s): "INR", "PROTIME" in the last 168 hours. Cardiac Enzymes: No results for input(s): "CKTOTAL", "CKMB", "CKMBINDEX", "TROPONINI" in the last 168 hours. BNP (last 3 results) No results for input(s):  "PROBNP" in the last 8760 hours. HbA1C: No results for input(s): "HGBA1C" in the last 72 hours. CBG: No results for input(s): "GLUCAP" in the last 168 hours. Lipid Profile: No results for input(s): "CHOL", "HDL", "LDLCALC", "TRIG", "CHOLHDL", "LDLDIRECT" in the last 72 hours. Thyroid Function Tests: No results for input(s): "TSH", "T4TOTAL", "FREET4", "T3FREE", "THYROIDAB" in the last 72 hours. Anemia Panel: No results for input(s): "VITAMINB12", "FOLATE", "FERRITIN", "TIBC", "IRON", "RETICCTPCT" in the last 72 hours. Sepsis Labs: Recent Labs  Lab 11/29/21 1136  LATICACIDVEN 1.0    No results found for this or any previous visit (from the past 240 hour(s)).       Radiology Studies: DG Chest Port 1 View  Result Date: 11/29/2021 CLINICAL DATA:  Altered level of consciousness. EXAM: PORTABLE CHEST 1 VIEW COMPARISON:  None Available. FINDINGS: Normal cardiomediastinal contours. No pleural effusion or edema identified. No airspace opacities. The visualized osseous structures are unremarkable. IMPRESSION: No acute cardiopulmonary abnormalities. Electronically Signed   By: Kerby Moors M.D.   On: 11/29/2021 11:35   CT HEAD WO CONTRAST  Result Date: 11/29/2021 CLINICAL DATA:  71 year old female with altered mental status over the pasti 2 days. EXAM: CT HEAD WITHOUT CONTRAST TECHNIQUE: Contiguous axial images were obtained from the base of the skull through the vertex without intravenous contrast. RADIATION DOSE REDUCTION: This exam was performed according to the departmental dose-optimization program which includes automated exposure control, adjustment of the mA and/or kV according to patient size and/or use of iterative reconstruction technique. COMPARISON:  Brain MRI 11/23/2021. FINDINGS: Brain: Stable cerebral volume. No midline shift, ventriculomegaly, mass effect, evidence of mass lesion, intracranial hemorrhage or evidence of cortically based acute infarction. Gray-white matter  differentiation appears preserved, and appears stable to the recent MRI with mild for age nonspecific white matter changes. No cortical encephalomalacia identified. Vascular: Mild Calcified atherosclerosis at the skull base. No suspicious intracranial vascular hyperdensity. Skull: No acute osseous abnormality identified. Sinuses/Orbits: Bubbly opacity in the left sphenoid sinus not significantly changed from earlier this month. Other Visualized paranasal sinuses and mastoids are stable and well aerated. Other: No acute orbit or scalp soft tissue finding. IMPRESSION: 1. Negative for age noncontrast CT appearance of the brain. 2. Mild paranasal sinus inflammation, stable from earlier this month. Electronically Signed   By: Genevie Ann M.D.   On: 11/29/2021 11:26        Scheduled Meds:  Continuous Infusions:  0.9 % NaCl with KCl 40 mEq / L 100 mL/hr at 11/29/21 1749          Vitaliy Eisenhour Starla Link, MD [facility] 11/30/2021, 9:27 AM

## 2021-11-30 NOTE — Plan of Care (Signed)

## 2021-11-30 NOTE — Consult Note (Signed)
Consultation Note Date: 11/30/2021   Patient Name: Jennifer Mccall [facility]  DOB: 1950/07/08  MRN: [id number]  Age / Sex: 71 y.o., female  PCP: Tisovec, Fransico Him, MD Referring Physician: Aline August, MD  Reason for Consultation: Establishing goals of care  HPI/Patient Profile: 71 y.o. female  with past medical history of dementia and HLD admitted on 11/29/2021 with AMD, FTT, and poor PO intake for the last several weeks. Outpatient MRI of brain was unremarkable. On admission to ED, CT of head, chest xray, and UA were unremarkable. Pt was given IV fluids and is being treated for FTT , dehydration, hypokalemia (2.9), thrombocytopenia, and likely worsening of dementia.   Clinical Assessment and Goals of Care: I have reviewed medical records including EPIC notes, labs and imaging, assessed the patient and then met with patient, patient's spouse Rush Landmark, and patient's daughters Wells Guiles and Estill Bamberg to discuss diagnosis prognosis, GOC, EOL wishes, disposition and options. Towards the end of our discussion, patient's first husband/father of her daughters joined the conversation.   I introduced Palliative Medicine as specialized medical care for people living with serious illness. It focuses on providing relief from the symptoms and stress of a serious illness. The goal is to improve quality of life for both the patient and the family.  A brief life review of the patient was not discussed as patient's husband Rush Landmark and children became too emotional to speak of patient's past.   As far as functional and nutritional status Bill notes patient had a significant decline in functional and cognition over the last 3 weeks. He shares the patient essentially became nonverbal and refused PO intake 4 days ago. He states she would only say "Help me!". Pt was no longer ambulating or transferring independently PTA.   We discussed patient's  current illness and what it means in the larger context of patient's on-going co-morbidities.  Natural disease trajectory and expectations at EOL were discussed.  I attempted to elicit values and goals of care important to the patient. Family was in agreement that patient would want to be kept comfortable and free from stress and pain. The difference between aggressive medical intervention and comfort care was considered in light of the patient's goals of care.   Education on EOL and comfort measures discussed in detail. Reviewed medications focused on comfort and discontinuation of medications, treatments, and labwork unless solely focused on comfort.   Hospice services were explained and offered. Family shares that they are unable to care for patient at home any longer and would like for patient to be evaluated for hospice inpatient unit. Reviewed hospice philosophy, inpatient setting, and need for evaluation and eligibility to be determined by hospice agency. Family voluntarily shared that they would like patient to be evaluated for [facility] since patient's husband Rush Landmark has experience with [employer].   Questions and concerns were addressed. The family was encouraged to call with questions or concerns.   Notified MD and TOC SW of family's wishes for full comfort measures and evaluation for [facility].  PMT will continue to monitor the patient throughout her hospitalization.   Primary Decision Maker NEXT OF KIN  Code Status/Advance Care Planning: DNR Full comfort measures - adjustments made to [facility] Patient made have comfort feeds from floor stock if asking for it, awake, and can safely swallow Education provided to RN on use of PRNs such as ativan, haldol, and morphine to address, anxiety, restlessness, pain, and agitation  Prognosis:   < 2 weeks  Discharge Planning: Hospice facility  Primary Diagnoses: Present on Admission:  Failure to thrive in adult   Physical  Exam Vitals reviewed.  Constitutional:      General: She is not in acute distress.    Appearance: Normal appearance. She is not ill-appearing.  HENT:     Nose: Nose normal.     Mouth/Throat:     Mouth: Mucous membranes are moist.  Cardiovascular:     Rate and Rhythm: Normal rate.     Pulses: Normal pulses.  Pulmonary:     Effort: Pulmonary effort is normal.  Abdominal:     Palpations: Abdomen is soft.  Musculoskeletal:     Comments: Generalized weakness  Skin:    General: Skin is warm.  Neurological:     Comments: nonverbal     Palliative Assessment/Data: 20%     I discussed this patient's plan of care with patient, patient's spouse Rush Landmark, patient's daughters Wells Guiles and Estill Bamberg and their father.  Thank you for this consult. Palliative medicine will continue to follow and assist holistically.   Time Total: 75 minutes Greater than 50%  of this time was spent counseling and coordinating care related to the above assessment and plan.  Signed by: Jordan Hawks, DNP, FNP-BC Palliative Medicine    Please contact Palliative Medicine Team phone at [phone] for questions and concerns.  For individual provider: See Shea Evans

## 2021-11-30 NOTE — TOC Initial Note (Signed)
Transition of Care [facility]) - Initial/Assessment Note    Patient Details  Name: Jennifer Mccall MRN: [id number] Date of Birth: [birth year]  [facility]) CM/SW Contact:    Joaquin Courts, RN Phone Number: 11/30/2021, 10:46 AM  Clinical Narrative:                 CM notified by provider that patient/ family request residential hospice placement.  [facility].  CM spoke with hospice rep who will review patient for eligibility and notify CM if bed comes available.  TOC will continue to follow for hospice bed availability.  Expected Discharge Plan: [place] Barriers to Discharge: Hospice Bed not available   Patient Goals and CMS Choice Patient states their goals for this hospitalization and ongoing recovery are:: to go to residential hospice CMS Medicare.gov Compare Post Acute Care list provided to:: Patient Represenative (must comment) Choice offered to / list presented to : Adult Children  Expected Discharge Plan and Services Expected Discharge Plan: [place]   Discharge Planning Services: CM Consult Post Acute Care Choice: Hospice Living arrangements for the past 2 months: Single Family Home                                      Prior Living Arrangements/Services Living arrangements for the past 2 months: Single Family Home   Patient language and need for interpreter reviewed:: Yes Do you feel safe going back to the place where you live?: No   needs hospice  Need for Family Participation in Patient Care: Yes (Comment) Care giver support system in place?: Yes (comment)   Criminal Activity/Legal Involvement Pertinent to Current Situation/Hospitalization: No - Comment as needed  Activities of Daily Living Home Assistive Devices/Equipment: Eyeglasses (reading glasses) ADL Screening (condition at time of admission) Patient's cognitive ability adequate to safely complete daily activities?:  No Is the patient deaf or have difficulty hearing?: No Does the patient have difficulty seeing, even when wearing glasses/contacts?: No Does the patient have difficulty concentrating, remembering, or making decisions?: Yes Patient able to express need for assistance with ADLs?: No Does the patient have difficulty dressing or bathing?: Yes Independently performs ADLs?: No Communication: Needs assistance Is this a change from baseline?: Pre-admission baseline Dressing (OT): Needs assistance Is this a change from baseline?: Pre-admission baseline Grooming: Needs assistance Is this a change from baseline?: Pre-admission baseline Feeding: Needs assistance Is this a change from baseline?: Pre-admission baseline Bathing: Needs assistance Is this a change from baseline?: Pre-admission baseline Toileting: Needs assistance Is this a change from baseline?: Pre-admission baseline In/Out Bed: Needs assistance Is this a change from baseline?: Pre-admission baseline Walks in Home: Dependent Is this a change from baseline?: Pre-admission baseline Does the patient have difficulty walking or climbing stairs?: Yes Weakness of Legs: Both Weakness of Arms/Hands: Both  Permission Sought/Granted                  Emotional Assessment Appearance:: Appears stated age         Psych Involvement: No (comment)  Admission diagnosis:  Dehydration [E86.0] Hypokalemia [E87.6] Failure to thrive in adult [R62.7] Altered mental status, unspecified altered mental status type [R41.82] Patient Active Problem List   Diagnosis Date Noted   Failure to thrive in adult 11/29/2021   Dementia without behavioral disturbance ([place]) 11/29/2021   Thrombocytopenia ([place]) 11/29/2021   Hypokalemia 11/29/2021   Dehydration  11/29/2021   PVC (premature ventricular contraction) 01/09/2013   SVT (supraventricular tachycardia) ([place]) 01/09/2013   Palpitations 03/22/2012   PCP:  Haywood Pao, MD Pharmacy:    [facility] [place], [address] AT [address] [place] [address] Phone: [phone] Fax: [phone]     Social Determinants of Health (SDOH) Interventions    Readmission Risk Interventions     No data to display

## 2021-11-30 NOTE — Progress Notes (Signed)
Chaplain met with Bellamia's family including her husband, her two daughters and their father (her ex-husband). They are spending time with her and providing her with comfort.  Chaplain introduced spiritual care services. The family reported that they are doing okay for the time being.  Chaplain let them know of ongoing availability for support.  Chaplain will follow up as time permits, but please also page as needs arise or as family requests.  [address], [place] Pager, [phone]

## 2021-11-30 NOTE — TOC Progression Note (Signed)
Transition of Care [facility]) - Progression Note    Patient Details  Name: Jennifer Mccall MRN: [id number] Date of Birth: 1951/03/13  Transition of Care [facility]) CM/SW Contact  Joaquin Courts, RN Phone Number: 11/30/2021, 2:27 PM  Clinical Narrative:    CM received [facility] that [facility] bed is expected to be available tomorrow morning. MD updated.   Expected Discharge Plan: [place] Barriers to Discharge: Hospice Bed not available  Expected Discharge Plan and Services Expected Discharge Plan: [place]   Discharge Planning Services: CM Consult Post Acute Care Choice: Hospice Living arrangements for the past 2 months: Single Family Home                                       Social Determinants of Health (SDOH) Interventions    Readmission Risk Interventions     No data to display

## 2021-12-01 DIAGNOSIS — E876 Hypokalemia: Secondary | ICD-10-CM | POA: Diagnosis not present

## 2021-12-01 DIAGNOSIS — D696 Thrombocytopenia, unspecified: Secondary | ICD-10-CM | POA: Diagnosis not present

## 2021-12-01 DIAGNOSIS — F0390 Unspecified dementia without behavioral disturbance: Secondary | ICD-10-CM | POA: Diagnosis not present

## 2021-12-01 DIAGNOSIS — R627 Adult failure to thrive: Secondary | ICD-10-CM | POA: Diagnosis not present

## 2021-12-01 MED ORDER — MORPHINE SULFATE (PF) 2 MG/ML IV SOLN: 1.0000 mg | INTRAVENOUS | Status: AC | PRN

## 2021-12-01 MED ORDER — LORAZEPAM 2 MG/ML IJ SOLN: 0.5000 mg | INTRAMUSCULAR | Status: AC | PRN

## 2021-12-01 NOTE — TOC Transition Note (Signed)
Transition of Care [facility]) - CM/SW Discharge Note   Patient Details  Name: Jennifer Mccall MRN: [id number] Date of Birth: 11-May-1951  Transition of Care [facility]) CM/SW Contact:  Joaquin Courts, RN Phone Number: 12/01/2021, 12:06 PM   Clinical Narrative:    CM received confirmation that hospice bed is available and all consents have been signed.  Patient to discharge to [facility] today.  [facility] transport arranged.   Final next level of care: [place] Barriers to Discharge: No Barriers Identified   Patient Goals and CMS Choice Patient states their goals for this hospitalization and ongoing recovery are:: to go to residential hospice CMS Medicare.gov Compare Post Acute Care list provided to:: Patient Represenative (must comment) Choice offered to / list presented to : Adult Children  Discharge Placement                       Discharge Plan and Services   Discharge Planning Services: CM Consult Post Acute Care Choice: Hospice                               Social Determinants of Health (SDOH) Interventions     Readmission Risk Interventions     No data to display

## 2021-12-01 NOTE — Progress Notes (Signed)
Chaplain engaged Jennifer Mccall's family in a follow up visit for support.  They are feeling anxious because they want to do what is best for her and to give her whatever comfort they are able to give.  Chaplain affirmed the loving care that they are giving her and normalized the anxiety that is felt when a loved one can no longer communicate their needs verbally.  Chaplain noted that sometimes when we feel helpless, we want so much to provide our loved one with something.  For [name]'s daughter, this has been feeding her applesauce in order to keep her mother's mouth moistened.  Chaplain encouraged them to use the small oral sponges to moisten her lips and keep her comfortable and discussed the risk of aspiration if she were to suck on the sponge.  The family understands that the role of the sponge is not to give hydration, but to keep her lips from becoming dry.  Chaplain also encouraged them to speak with her, to tell her stories.  They are closely watching her reactions to things and learned that music led to her being agitated.  Chaplain affirmed their awareness of how she is communicating and encouraged them to continue to look for her responses to things.  They were concerned about [employer].  Chaplain shared her own positive experience with [facility] and that gave them comfort.  [address], [place] Pager, [phone]

## 2021-12-01 NOTE — Discharge Summary (Signed)
Physician Discharge Summary  Jennifer Mccall [facility] LKG:[id number] DOB: 07-Apr-1951  PCP: Haywood Pao, MD  Admitted from: Home Discharged to: Lane date: 11/29/2021 Discharge date: 12/01/2021  Recommendations for Outpatient Follow-up:    Follow-up Information     MD at [facility] Follow up.   Why: For ongoing hospice care.        Tisovec, Fransico Him, MD Follow up.   Specialty: Internal Medicine Why: Just an FYI. Contact information: [place] [address] [place]: Not applicable    Equipment/Devices: None    Discharge Condition: Guarded with poor prognosis going to residential hospice for end-of-life hospice care.   Code Status: DNR Diet recommendation:  Discharge Diet Orders (From admission, onward)     Start     Ordered   12/01/21 0000  Diet general       Comments: Comfort feeds of choice with family being aware of aspiration risks.   12/01/21 1133             Discharge Diagnoses:  Principal Problem:   Failure to thrive in adult Active Problems:   Dementia without behavioral disturbance (HCC)   Thrombocytopenia (HCC)   Hypokalemia   Dehydration   Brief Summary: 71 y.o. female with medical history significant of dementia, hyperlipidemia presented with altered mental status, failure to thrive with extremely poor oral intake for the last few weeks with recent outpatient unremarkable MRI of brain and other lab results.  On presentation, potassium was 2.9.  CT of head was negative for acute intracranial abnormality.  Chest x-ray was negative for acute infiltrates.  UA unremarkable.  She was started on IV fluids.   Assessment & Plan:   Failure to thrive Possible worsening dementia -Presented with failure to thrive with very poor oral intake over the last few weeks.  Work-up as an outpatient including MRI of brain has been extensively unremarkable. -CT of the head, chest x-ray  and UA here unremarkable -Palliative care medicine evaluated on 7/18, she was transitioned to DNR and full comfort care. -Continue supportive care including gentle hydration. -All medications nonessential to comfort were discontinued. -Patient is comfortable and arrangements are in place for patient to be discharged to [facility] today.   Dehydration -Now on comfort care.  IVF stopped.   Hypokalemia -Plan as above   Thrombocytopenia -Questionable cause.  No signs of bleeding.       Consultations: Palliative care medicine.  Procedures: None.   Discharge Instructions  Discharge Instructions     Call MD for:  difficulty breathing, headache or visual disturbances   Complete by: As directed    Call MD for:  persistant nausea and vomiting   Complete by: As directed    Call MD for:  severe uncontrolled pain   Complete by: As directed    Call MD for:  temperature >100.4   Complete by: As directed    Diet general   Complete by: As directed    Comfort feeds of choice with family being aware of aspiration risks.   Increase activity slowly   Complete by: As directed         Medication List     STOP taking these medications    acyclovir 200 MG capsule Commonly known as: ZOVIRAX   clonazePAM 0.5 MG tablet Commonly known as: KLONOPIN   memantine 10 MG tablet Commonly known as:  NAMENDA   Quintabs Tabs   sertraline 25 MG tablet Commonly known as: ZOLOFT   sertraline 50 MG tablet Commonly known as: ZOLOFT       TAKE these medications    cycloSPORINE 0.05 % ophthalmic emulsion Commonly known as: RESTASIS Place 1 drop into both eyes 2 (two) times daily as needed (dry eyes).   LORazepam 2 MG/ML injection Commonly known as: ATIVAN Inject 0.25-0.5 mLs (0.5-1 mg total) into the vein every 4 (four) hours as needed for anxiety (agitation).   morphine (PF) 2 MG/ML injection Inject 0.5-1 mLs (1-2 mg total) into the vein every 4 (four) hours as needed (Pain or  dyspnea.).       Allergies  Allergen Reactions   Codeine Anaphylaxis   Erythromycin Hives   Codeine    Demerol [Meperidine Hcl] Nausea And Vomiting   Demerol [Meperidine]    Erythorbic Acid    Erythromycin Base Hives   Ether Nausea And Vomiting   Tape     Other reaction(s): Other (See Comments) Pt has thin skin, prefers paper tape      Procedures/Studies: DG Chest Port 1 View  Result Date: 11/29/2021 CLINICAL DATA:  Altered level of consciousness. EXAM: PORTABLE CHEST 1 VIEW COMPARISON:  None Available. FINDINGS: Normal cardiomediastinal contours. No pleural effusion or edema identified. No airspace opacities. The visualized osseous structures are unremarkable. IMPRESSION: No acute cardiopulmonary abnormalities. Electronically Signed   By: Kerby Moors M.D.   On: 11/29/2021 11:35   CT HEAD WO CONTRAST  Result Date: 11/29/2021 CLINICAL DATA:  71 year old female with altered mental status over the pasti 2 days. EXAM: CT HEAD WITHOUT CONTRAST TECHNIQUE: Contiguous axial images were obtained from the base of the skull through the vertex without intravenous contrast. RADIATION DOSE REDUCTION: This exam was performed according to the departmental dose-optimization program which includes automated exposure control, adjustment of the mA and/or kV according to patient size and/or use of iterative reconstruction technique. COMPARISON:  Brain MRI 11/23/2021. FINDINGS: Brain: Stable cerebral volume. No midline shift, ventriculomegaly, mass effect, evidence of mass lesion, intracranial hemorrhage or evidence of cortically based acute infarction. Gray-white matter differentiation appears preserved, and appears stable to the recent MRI with mild for age nonspecific white matter changes. No cortical encephalomalacia identified. Vascular: Mild Calcified atherosclerosis at the skull base. No suspicious intracranial vascular hyperdensity. Skull: No acute osseous abnormality identified. Sinuses/Orbits:  Bubbly opacity in the left sphenoid sinus not significantly changed from earlier this month. Other Visualized paranasal sinuses and mastoids are stable and well aerated. Other: No acute orbit or scalp soft tissue finding. IMPRESSION: 1. Negative for age noncontrast CT appearance of the brain. 2. Mild paranasal sinus inflammation, stable from earlier this month. Electronically Signed   By: Genevie Ann M.D.   On: 11/29/2021 11:26   MR BRAIN W WO CONTRAST  Result Date: 11/23/2021 CLINICAL DATA:  Confusion R41.0 (ICD-10-CM). Unsteady gait R26.81 (ICD-10-CM). EXAM: MRI HEAD WITHOUT AND WITH CONTRAST TECHNIQUE: Multiplanar, multiecho pulse sequences of the brain and surrounding structures were obtained without and with intravenous contrast. CONTRAST:  79m GADAVIST GADOBUTROL 1 MMOL/ML IV SOLN COMPARISON:  MRI of the brain August 17, 2019. FINDINGS: Brain: No acute infarction, hemorrhage, hydrocephalus, extra-axial collection or mass lesion. Scattered foci of T2 hyperintensity are seen within the white matter of the cerebral hemispheres and within the pons, nonspecific, most likely related to chronic small vessel ischemia. Mild parenchymal volume loss. No focus of abnormal contrast enhancement identified. No significant change from prior MRI. Vascular: Normal  flow voids. Skull and upper cervical spine: Normal marrow signal. Sinuses/Orbits: Bilateral lens surgery. Tiny mucous retention cyst in the right maxillary sinus. Other: None. IMPRESSION: 1. No acute intracranial abnormality. 2. 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 3. Mild parenchymal volume loss. Electronically Signed   By: Pedro Earls M.D.   On: 11/23/2021 20:17      Subjective: Patient nonverbal.  Family (patient's spouse, brother and daughter at bedside).  Reported has been sleeping since morning, unable to take p.o.  Some tremors.  Stated that she has not been  in pain or dyspneic-same was confirmed with patient's RN  Discharge Exam:  Vitals:   11/30/21 0645 11/30/21 1400 11/30/21 2050 11/30/21 2334  BP: (!) 142/83     Pulse: 89     Resp: 16     Temp: 98.2 F (36.8 C) 99.6 F (37.6 C) 100.1 F (37.8 C) 98.3 F (36.8 C)  TempSrc: Oral Axillary Axillary Axillary  SpO2: 96%       General: Elderly female, moderately built, frail and cachectic.  Lying comfortably supine in bed without distress. Cardiovascular: S1 & S2 heard, RRR, S1/S2 +. No murmurs, rubs, gallops or clicks. No JVD or pedal edema. Respiratory: Poor respiratory effort.  Decreased breath sounds bilaterally.  No gasping or apneic spells noted.  Breathing even and nonlabored. Abdominal:  Non distended, non tender & soft. No organomegaly or masses appreciated. Normal bowel sounds heard. CNS: Patient sleeping or sedated, did not attempt to arouse since she is comfortable. Extremities: no edema, no cyanosis    The results of significant diagnostics from this hospitalization (including imaging, microbiology, ancillary and laboratory) are listed below for reference.     Microbiology: No results found for this or any previous visit (from the past 240 hour(s)).   Labs: CBC: Recent Labs  Lab 11/29/21 1136  WBC 6.9  NEUTROABS 5.7  HGB 15.4*  HCT 44.2  MCV 91.9  PLT 126*    Basic Metabolic Panel: Recent Labs  Lab 11/29/21 1136  NA 138  K 2.9*  CL 104  CO2 23  GLUCOSE 95  BUN 13  CREATININE 0.52  CALCIUM 8.8*    Liver Function Tests: Recent Labs  Lab 11/29/21 1136  AST 31  ALT 21  ALKPHOS 38  BILITOT 2.1*  PROT 6.9  ALBUMIN 4.2     Urinalysis    Component Value Date/Time   COLORURINE YELLOW 11/29/2021 1057   APPEARANCEUR CLEAR 11/29/2021 1057   LABSPEC 1.016 11/29/2021 1057   PHURINE 6.0 11/29/2021 1057   GLUCOSEU NEGATIVE 11/29/2021 1057   HGBUR SMALL (A) 11/29/2021 1057   BILIRUBINUR NEGATIVE 11/29/2021 1057   KETONESUR 20 (A) 11/29/2021 1057    PROTEINUR NEGATIVE 11/29/2021 1057   NITRITE NEGATIVE 11/29/2021 1057   LEUKOCYTESUR NEGATIVE 11/29/2021 1057    Discussed in detail with patient's spouse, brother and daughter at bedside.  Time coordinating discharge: 25 minutes  SIGNED:  Vernell Leep, MD,  FACP, [facility], [facility], [facility] (Care Management Physician Certified). [facility]  To contact the attending provider between 7A-7P or the covering provider during after hours 7P-7A, please log into the web site [email or web address] and access using universal [place] password for that web site. If you do not have the password, please call the hospital operator.

## 2021-12-01 NOTE — Progress Notes (Signed)
Report called to [facility] place.

## 2021-12-14 DEATH — deceased

## 2021-12-15 ENCOUNTER — Institutional Professional Consult (permissible substitution): Payer: Medicare Other | Admitting: Diagnostic Neuroimaging
